# Patient Record
Sex: Male | Born: 1955 | Race: Black or African American | Hispanic: No | Marital: Married | Smoking: Never smoker
Health system: Southern US, Community
[De-identification: ages and names within clinical notes are randomized; demographics above are authoritative.]

## PROBLEM LIST (undated history)

## (undated) DIAGNOSIS — N189 Chronic kidney disease, unspecified: Secondary | ICD-10-CM

## (undated) DIAGNOSIS — E785 Hyperlipidemia, unspecified: Secondary | ICD-10-CM

## (undated) DIAGNOSIS — Z9289 Personal history of other medical treatment: Secondary | ICD-10-CM

## (undated) DIAGNOSIS — I119 Hypertensive heart disease without heart failure: Secondary | ICD-10-CM

## (undated) DIAGNOSIS — I10 Essential (primary) hypertension: Secondary | ICD-10-CM

## (undated) DIAGNOSIS — G4733 Obstructive sleep apnea (adult) (pediatric): Secondary | ICD-10-CM

## (undated) HISTORY — DX: Personal history of other medical treatment: Z92.89

## (undated) HISTORY — DX: Hypertensive heart disease without heart failure: I11.9

## (undated) HISTORY — DX: Hyperlipidemia, unspecified: E78.5

## (undated) HISTORY — DX: Chronic kidney disease, unspecified: N18.9

## (undated) HISTORY — DX: Obstructive sleep apnea (adult) (pediatric): G47.33

---

## 2009-03-22 HISTORY — PX: US ECHOCARDIOGRAPHY: HXRAD669

## 2010-01-01 ENCOUNTER — Emergency Department (HOSPITAL_COMMUNITY)
Admission: EM | Admit: 2010-01-01 | Discharge: 2010-01-01 | Payer: Self-pay | Source: Home / Self Care | Admitting: Emergency Medicine

## 2010-01-01 IMAGING — CR DG CHEST 2V
2 series · 2 of 2 positions shown · non-contrast
Comparison: None

CLINICAL DATA: Chest pain.

CHEST - 2 VIEW

[w chest pa]
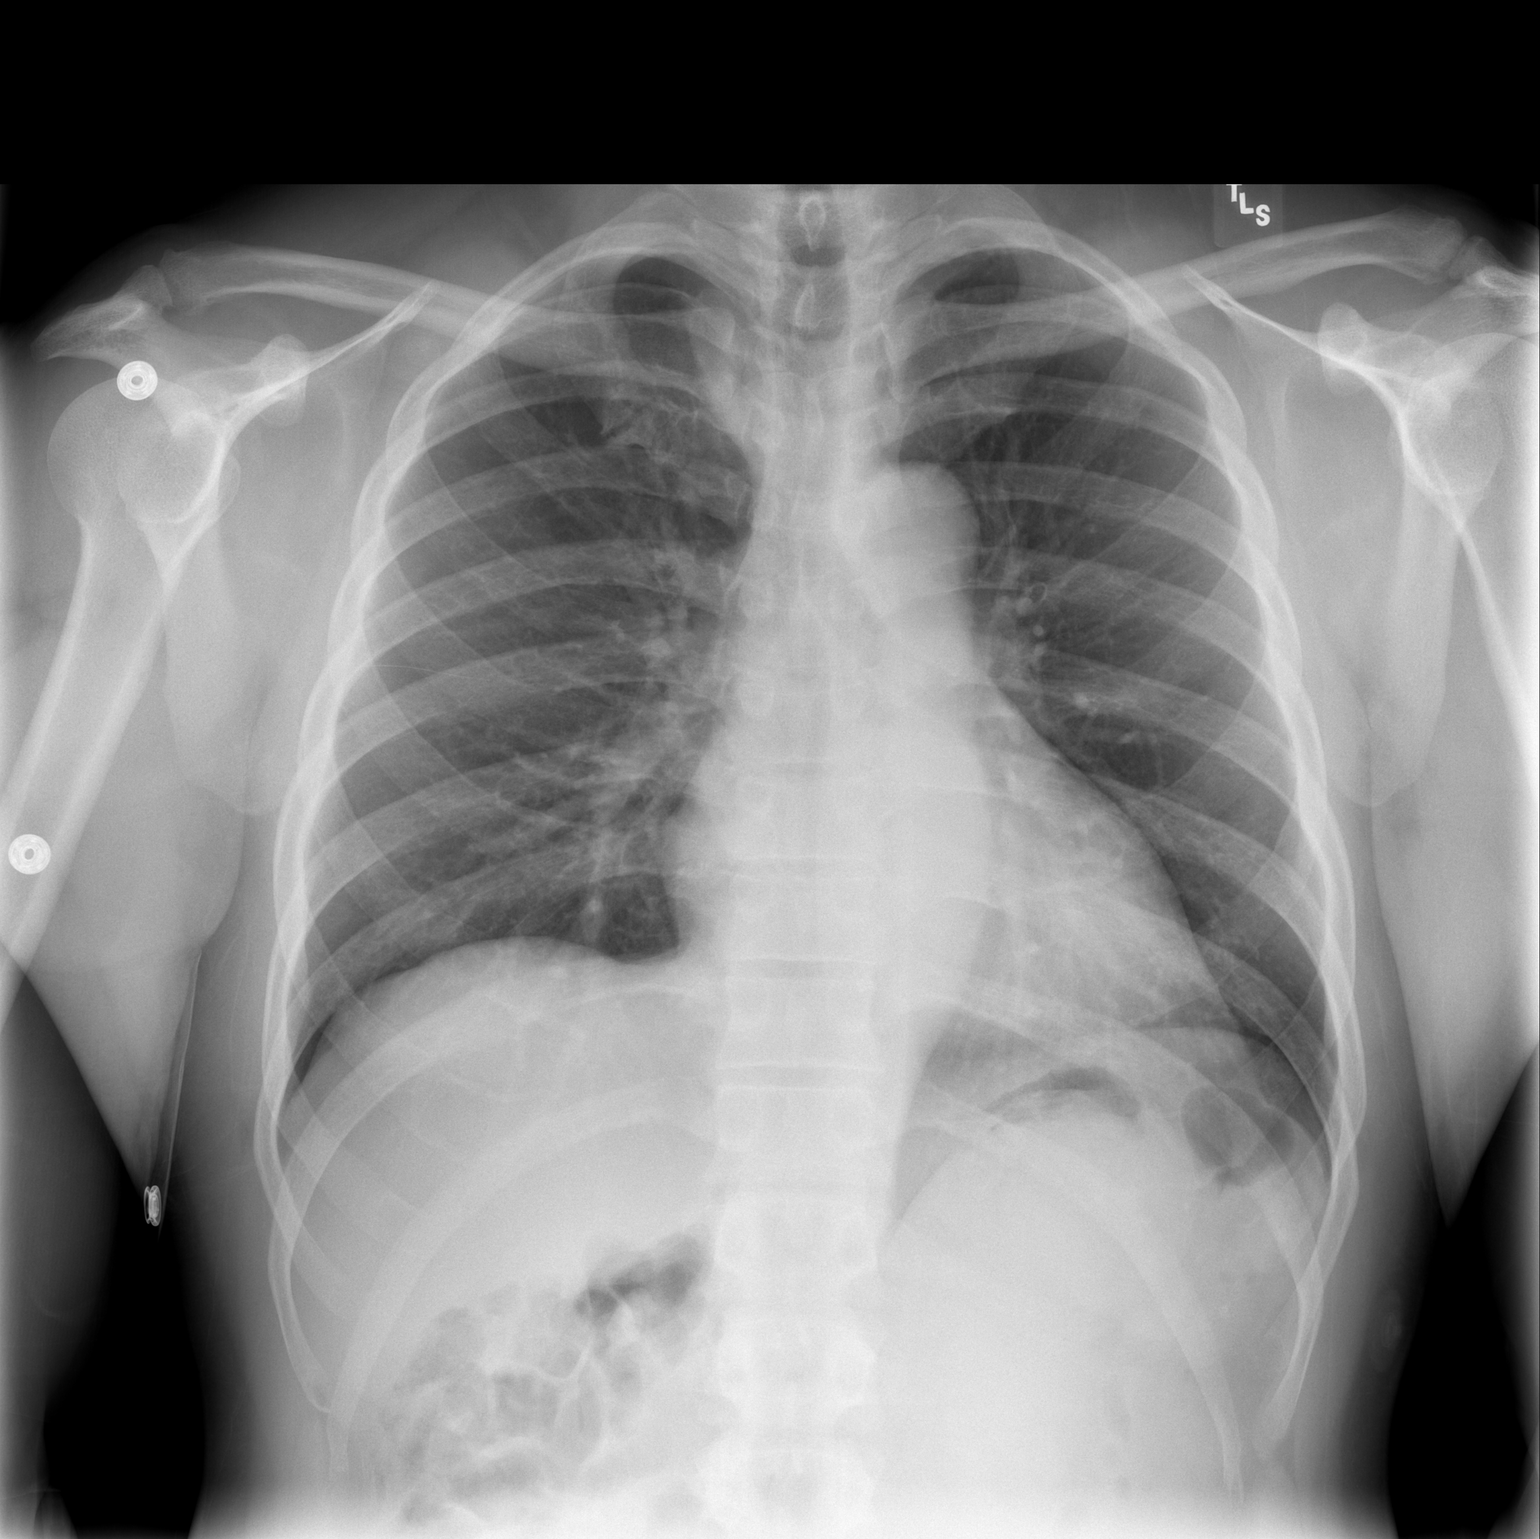

[w chest lat *]
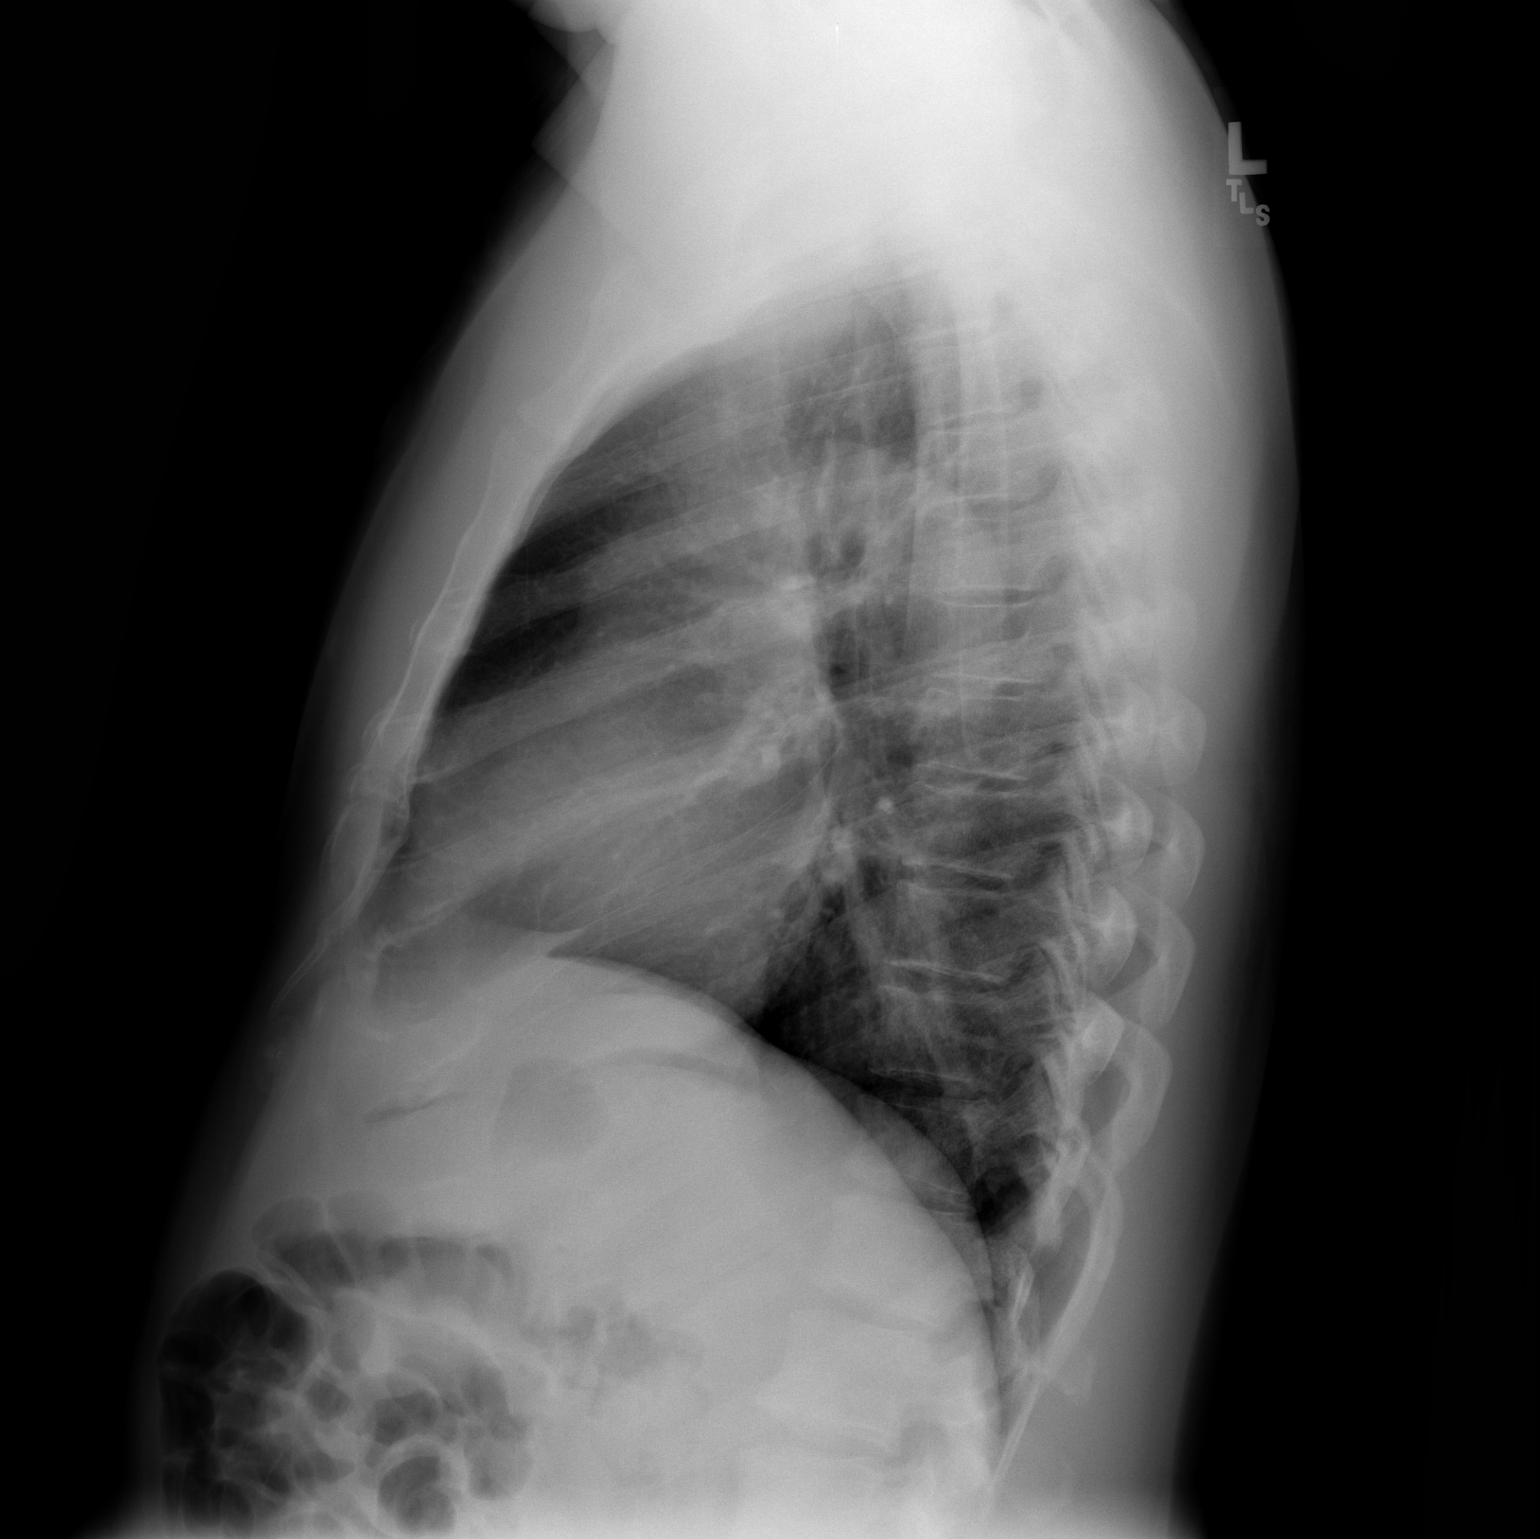

[2 of 2 positions shown; findings below may reference images not displayed]

FINDINGS: Heart and mediastinal contours are within normal limits.
No focal opacities or effusions.  No acute bony abnormality.
IMPRESSION: No active disease.

## 2010-01-11 HISTORY — PX: NM MYOCAR PERF WALL MOTION: HXRAD629

## 2010-03-28 LAB — POCT CARDIAC MARKERS: Myoglobin, poc: 222 ng/mL (ref 12–200)

## 2012-08-04 ENCOUNTER — Encounter: Payer: Self-pay | Admitting: *Deleted

## 2012-08-06 ENCOUNTER — Encounter: Payer: Self-pay | Admitting: Cardiovascular Disease

## 2012-08-07 ENCOUNTER — Ambulatory Visit (INDEPENDENT_AMBULATORY_CARE_PROVIDER_SITE_OTHER): Payer: Self-pay | Admitting: Cardiovascular Disease

## 2012-08-07 ENCOUNTER — Encounter: Payer: Self-pay | Admitting: Cardiovascular Disease

## 2012-08-07 VITALS — BP 122/82 | HR 64 | Resp 12 | Ht 68.0 in | Wt 178.9 lb

## 2012-08-07 DIAGNOSIS — N529 Male erectile dysfunction, unspecified: Secondary | ICD-10-CM

## 2012-08-07 DIAGNOSIS — E785 Hyperlipidemia, unspecified: Secondary | ICD-10-CM

## 2012-08-07 DIAGNOSIS — I10 Essential (primary) hypertension: Secondary | ICD-10-CM

## 2012-08-07 DIAGNOSIS — N508 Other specified disorders of male genital organs: Secondary | ICD-10-CM

## 2012-08-07 DIAGNOSIS — I119 Hypertensive heart disease without heart failure: Secondary | ICD-10-CM

## 2012-08-07 DIAGNOSIS — N5319 Other ejaculatory dysfunction: Secondary | ICD-10-CM

## 2012-08-07 NOTE — Patient Instructions (Addendum)
Your physician recommends that you schedule a follow-up appointment in: One year.  

## 2012-08-11 ENCOUNTER — Encounter: Payer: Self-pay | Admitting: Cardiovascular Disease

## 2012-08-11 DIAGNOSIS — N5319 Other ejaculatory dysfunction: Secondary | ICD-10-CM | POA: Insufficient documentation

## 2012-08-11 DIAGNOSIS — I10 Essential (primary) hypertension: Secondary | ICD-10-CM | POA: Insufficient documentation

## 2012-08-11 DIAGNOSIS — I119 Hypertensive heart disease without heart failure: Secondary | ICD-10-CM | POA: Insufficient documentation

## 2012-08-11 DIAGNOSIS — E785 Hyperlipidemia, unspecified: Secondary | ICD-10-CM | POA: Insufficient documentation

## 2012-08-11 DIAGNOSIS — N529 Male erectile dysfunction, unspecified: Secondary | ICD-10-CM | POA: Insufficient documentation

## 2012-08-11 NOTE — Progress Notes (Signed)
Patient ID: Jeffery Strickland, male   DOB: 07/19/1955, 57 y.o.   MRN: [id number]      Reason for office visit Hypertensive heart disease  He has done quite well since his last appointment and has no new complaints today. He has good functional status and goes walking on a regular basis. Unfortunately has not made much progress with weight loss and remains moderately overweight. 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 Switching beta blockers did help to some degree.    No Known Allergies  Current Outpatient Prescriptions  Medication Sig Dispense Refill  . aspirin 81 MG tablet Take 81 mg by mouth daily.      . nebivolol (BYSTOLIC) 10 MG tablet Take 10 mg by mouth daily.      . Olmesartan-Amlodipine-HCTZ (TRIBENZOR) 40-10-25 MG TABS Take 1 tablet by mouth daily.      . potassium chloride SA (K-DUR,KLOR-CON) 20 MEQ tablet Take 20 mEq by mouth 2 (two) times daily.      . rosuvastatin (CRESTOR) 10 MG tablet Take 10 mg by mouth daily.      Marland Kitchen spironolactone (ALDACTONE) 25 MG tablet daily.       No current facility-administered medications for this visit.    Past Medical History  Diagnosis Date  . Systemic hypertension   . Hyperlipemia     Past Surgical History  Procedure Laterality Date  . 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 Nm myocar perf wall motion  01/11/2010    normal    Family History  Problem Relation Age of Onset  . Heart attack Father   . Cancer Sister   . Diabetes Sister     History   Social History  . Marital Status: Single    Spouse Name: N/A    Number of Children: N/A  . Years of Education: N/A   Occupational History  . Not on file.   Social History Main Topics  . Smoking status: Never Smoker   . Smokeless tobacco: Not on file  . Alcohol Use: No  . Drug Use: No  . Sexually Active: Not on file   Other  Topics Concern  . Not on file   Social History Narrative  . No narrative on file    Review of systems: The patient specifically denies any chest pain at rest or with exertion, dyspnea at rest or with exertion, orthopnea, paroxysmal nocturnal dyspnea, syncope, palpitations, focal neurological deficits, intermittent claudication, lower extremity edema, unexplained weight gain, cough, hemoptysis or wheezing.  The patient also denies abdominal pain, nausea, vomiting, dysphagia, diarrhea, constipation, polyuria, polydipsia, dysuria, hematuria, frequency, urgency, abnormal bleeding or bruising, fever, chills, unexpected weight changes, mood swings, change in skin or hair texture, change in voice quality, auditory or visual problems, allergic reactions or rashes, new musculoskeletal complaints other than usual "aches and pains".   PHYSICAL EXAM BP 122/82  Pulse 64  Resp 12  Ht 5\' 8"  (1.727 m)  Wt 178 lb 14.4 oz (81.149 kg)  BMI 27.21 kg/m2  General: Alert, oriented x3, no distress Head: no evidence of trauma, PERRL, EOMI, no exophtalmos or lid lag, no myxedema, no xanthelasma; normal ears, nose and oropharynx Neck: normal jugular venous pulsations and no hepatojugular reflux; brisk carotid pulses without delay and no carotid bruits Chest: clear to auscultation, no signs of consolidation by percussion or palpation, normal fremitus, symmetrical and full respiratory excursions  Cardiovascular: normal position and quality of the apical impulse, regular rhythm, normal first and second heart sounds, no murmurs, rubs or gallops Abdomen: no tenderness or distention, no masses by palpation, no abnormal pulsatility or arterial bruits, normal bowel sounds, no hepatosplenomegaly Extremities: no clubbing, cyanosis or edema; 2+ radial, ulnar and brachial pulses bilaterally; 2+ right femoral, posterior tibial and dorsalis pedis pulses; 2+ left femoral, posterior tibial and dorsalis pedis pulses; no subclavian or  femoral bruits Neurological: grossly nonfocal   EKG: Normal sinus rhythm, LVH with secondary changes  Most recent labs from 2013 Lipid Panel  Cholesterol 141, triglycerides 96, HDL 41, LDL 81  BMET Creatinine 1.29 , K 3.3, normal liver function tests   ASSESSMENT AND PLAN Hypertensive heart disease without CHF He is evidence of left ventricular hypertrophy by ecg, less prominently so on echo. He had a normal nuclear stress test in 2011. Most recent creatinine is within normal limits at 1.29. Blood pressure control is good.  Other ejaculatory dysfunction Switching to bystolic instead of metoprolol seems to have helped partially.  Hyperlipidemia Satisfactory lipid profile on current therapy.  HTN (hypertension) No evidence of renal artery stenosis by duplex ultrasound in 2011  Orders Placed This Encounter  Procedures  . EKG 12-Lead    Jeffery Strickland  Jeffery Fair, MD, [facility] [phone] office [phone] pager

## 2012-08-11 NOTE — Assessment & Plan Note (Signed)
He is evidence of left ventricular hypertrophy by ecg, less prominently so on echo. He had a normal nuclear stress test in 2011. Most recent creatinine is within normal limits at 1.29. Blood pressure control is good.

## 2012-08-11 NOTE — Assessment & Plan Note (Signed)
Switching to bystolic instead of metoprolol seems to have helped partially.

## 2012-08-11 NOTE — Assessment & Plan Note (Signed)
No evidence of renal artery stenosis by duplex ultrasound in 2011

## 2012-08-11 NOTE — Assessment & Plan Note (Signed)
Satisfactory lipid profile on current therapy.

## 2012-08-13 ENCOUNTER — Other Ambulatory Visit: Payer: Self-pay | Admitting: Cardiovascular Disease

## 2012-08-13 NOTE — Telephone Encounter (Signed)
Rx was sent to pharmacy electronically. 

## 2012-12-01 ENCOUNTER — Ambulatory Visit (INDEPENDENT_AMBULATORY_CARE_PROVIDER_SITE_OTHER): Payer: BC Managed Care – PPO | Admitting: Emergency Medicine

## 2012-12-01 VITALS — BP 104/70 | HR 79 | Temp 98.7°F | Resp 16 | Ht 67.8 in | Wt 180.6 lb

## 2012-12-01 DIAGNOSIS — S61209A Unspecified open wound of unspecified finger without damage to nail, initial encounter: Secondary | ICD-10-CM

## 2012-12-01 DIAGNOSIS — Z23 Encounter for immunization: Secondary | ICD-10-CM

## 2012-12-01 DIAGNOSIS — M79609 Pain in unspecified limb: Secondary | ICD-10-CM

## 2012-12-01 NOTE — Progress Notes (Signed)
VCO. Metacarpal block with 2% lidocaine plain.  Betadine prep.  Small <0.5 cm incision made distal to puncture wound. Splinter removed easily.  Wound explored revealing no other wood pieces or FB.  Repaired with #1 SI suture using 5-0 ethilon.  Cleaned and band-aid applied. Patient tolerated well. Return in 7 days for suture removal.

## 2012-12-01 NOTE — Progress Notes (Signed)
[facility] [address], [place] [address] [phone]  Date:  12/01/2012   Name:  Jeffery Strickland   DOB:  06/27/1955   MRN:  [id number]  PCP:  Geraldo Pitter, MD    Chief Complaint: F.B. finger   History of Present Illness:  Garik Diamant is a 57 y.o. very pleasant male patient who presents with the following:  Working outside and impaled his finger (right middle) with a splinter.  Has removed a portion of the splinter in the dorsum of his finger but has pain with flexion of the finger.  Not current on TD.  Denies other complaint or health concern today.   Patient Active Problem List   Diagnosis Date Noted  . HTN (hypertension) 08/11/2012  . Hyperlipidemia 08/11/2012  . Hypertensive heart disease without CHF 08/11/2012  . Other ejaculatory dysfunction 08/11/2012    Past Medical History  Diagnosis Date  . Systemic hypertension   . Hyperlipemia     Past Surgical History  Procedure Laterality Date  . 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 Nm myocar perf wall motion  01/11/2010    normal    History  Substance Use Topics  . Smoking status: Never Smoker   . Smokeless tobacco: Not on file  . Alcohol Use: No    Family History  Problem Relation Age of Onset  . Heart attack Father   . Cancer Sister   . Diabetes Sister     No Known Allergies  Medication list has been reviewed and updated.  Current Outpatient Prescriptions on File Prior to Visit  Medication Sig Dispense Refill  . aspirin 81 MG tablet Take 81 mg by mouth daily.      . nebivolol (BYSTOLIC) 10 MG tablet Take 10 mg by mouth daily.      . potassium chloride SA (K-DUR,KLOR-CON) 20 MEQ tablet Take 20 mEq by mouth 2 (two) times daily.      . rosuvastatin (CRESTOR) 10 MG tablet Take 10 mg by mouth daily.      Marya Landry 40-10-25 MG TABS TAKE 1 TABLET BY MOUTH DAILY.  30 tablet  7    No current facility-administered medications on file prior to visit.    Review of Systems:  As per HPI, otherwise negative.    Physical Examination: Filed Vitals:   12/01/12 1537  BP: 104/70  Pulse: 79  Temp: 98.7 F (37.1 C)  Resp: 16   Filed Vitals:   12/01/12 1537  Height: 5' 7.8" (1.722 m)  Weight: 180 lb 9.6 oz (81.92 kg)   Body mass index is 27.63 kg/(m^2). Ideal Body Weight: Weight in (lb) to have BMI = 25: 163.1   GEN: WDWN, NAD, Non-toxic, Alert & Oriented x 3 HEENT: Atraumatic, Normocephalic.  Ears and Nose: No external deformity. EXTR: No clubbing/cyanosis/edema NEURO: Normal gait.  PSYCH: Normally interactive. Conversant. Not depressed or anxious appearing.  Calm demeanor.  Right hand puncture wound on middle phalange dorsal third finger.  No visible FB  Assessment and Plan: Foreign body right fourth finger TDAP  Signed,  Phillips Odor, MD

## 2012-12-22 ENCOUNTER — Ambulatory Visit (INDEPENDENT_AMBULATORY_CARE_PROVIDER_SITE_OTHER): Payer: BC Managed Care – PPO | Admitting: Physician Assistant

## 2012-12-22 VITALS — Temp 98.1°F

## 2012-12-22 DIAGNOSIS — S61209A Unspecified open wound of unspecified finger without damage to nail, initial encounter: Secondary | ICD-10-CM

## 2012-12-22 NOTE — Progress Notes (Signed)
Removed one suture from right middle finger after evaluation by Porfirio Oar, PA-C

## 2013-01-13 ENCOUNTER — Other Ambulatory Visit: Payer: Self-pay | Admitting: *Deleted

## 2013-01-13 MED ORDER — NEBIVOLOL HCL 10 MG PO TABS: 10.0000 mg | ORAL_TABLET | Freq: Every day | ORAL | Status: DC

## 2013-01-13 NOTE — Telephone Encounter (Signed)
Rx was sent to pharmacy electronically. 

## 2013-11-22 ENCOUNTER — Ambulatory Visit (INDEPENDENT_AMBULATORY_CARE_PROVIDER_SITE_OTHER): Payer: BC Managed Care – PPO | Admitting: Family Medicine

## 2013-11-22 VITALS — BP 112/64 | HR 59 | Temp 98.1°F | Resp 16 | Ht 67.0 in | Wt 173.2 lb

## 2013-11-22 DIAGNOSIS — R3129 Other microscopic hematuria: Secondary | ICD-10-CM

## 2013-11-22 DIAGNOSIS — R55 Syncope and collapse: Secondary | ICD-10-CM

## 2013-11-22 DIAGNOSIS — R312 Other microscopic hematuria: Secondary | ICD-10-CM

## 2013-11-22 LAB — POCT UA - MICROSCOPIC ONLY
Bacteria, U Microscopic: NEGATIVE
CASTS, UR, LPF, POC: NEGATIVE
CRYSTALS, UR, HPF, POC: NEGATIVE
Epithelial cells, urine per micros: NEGATIVE
Mucus, UA: NEGATIVE
WBC, UR, HPF, POC: NEGATIVE
Yeast, UA: NEGATIVE

## 2013-11-22 LAB — POCT CBC
GRANULOCYTE PERCENT: 73.7 % (ref 37–80)
HCT, POC: 48.6 % (ref 43.5–53.7)
HEMOGLOBIN: 15.9 g/dL (ref 14.1–18.1)
LYMPH, POC: 2.1 (ref 0.6–3.4)
MCH, POC: 29 pg (ref 27–31.2)
MCHC: 32.8 g/dL (ref 31.8–35.4)
MCV: 88.5 fL (ref 80–97)
MID (CBC): 0.3 (ref 0–0.9)
MPV: 7.7 fL (ref 0–99.8)
PLATELET COUNT, POC: 230 10*3/uL (ref 142–424)
POC Granulocyte: 6.6 (ref 2–6.9)
POC LYMPH %: 22.8 % (ref 10–50)
POC MID %: 3.5 % (ref 0–12)
RBC: 5.49 M/uL (ref 4.69–6.13)
RDW, POC: 13.8 %
WBC: 9 10*3/uL (ref 4.6–10.2)

## 2013-11-22 LAB — POCT GLYCOSYLATED HEMOGLOBIN (HGB A1C): HEMOGLOBIN A1C: 5.7

## 2013-11-22 LAB — POCT URINALYSIS DIPSTICK
BILIRUBIN UA: NEGATIVE
Glucose, UA: NEGATIVE
KETONES UA: NEGATIVE
LEUKOCYTES UA: NEGATIVE
Nitrite, UA: NEGATIVE
PH UA: 5.5
Protein, UA: NEGATIVE
Spec Grav, UA: 1.025
UROBILINOGEN UA: 0.2

## 2013-11-22 LAB — GLUCOSE, POCT (MANUAL RESULT ENTRY): POC Glucose: 92 mg/dl (ref 70–99)

## 2013-11-22 NOTE — Progress Notes (Addendum)
Subjective:  This chart was scribed for Norberto SorensonEva Rani Idler, MD by Nishan PaoNadim Abu Hashem, ED Scribe at [facility].The patient was seen in exam room 14 and the patient's care was started at 4:00 PM.   Patient ID: Jeffery Strickland, male    DOB: 1955/12/25, 58 y.o.   MRN: [id number] Chief Complaint  Patient presents with  . Weakness    Had an episode this afternoon with diaphoresis, dizziness, and all over weakness. Lasted about 15 mins.- feels fine now   HPI Comments: Jeffery CapHaywood Cihlar is a 58 y.o. male [facility] that occurred this afternoon. Pt notes he felt light headed so he sat down. He suddenly broke out in a sweat, he says it felt like he was working out in the sun. He says when he was standing up he did feel light headed but when he sat down he was fine. Pt has nausea, dizziness and weakness as associated symptoms.  Pt says after 15 min his episode gradually subsided. He notes a similar complaint when he was out in the yard working about 6-9 months ago. He denies CP, abdominal pain, melena, abnormal urine, vision changes, and edema in his lower extremities.  He has been drinking plenty of water and did not over exert himself yesterday.  He says his son has been sick and showing similar symptoms.  Doctor check up in October and everything was fine  No changes in his medication, he has not ran out of his medication. Pt notes he has not taken his HTN medication this morning. He usually takes them at 8:00 AM and he states it is not uncommon for him to miss a dose in the weekend.   Pt note DM runs in his family and his wife believes his blood sugar may have fallen.  His last visit with Dr. Amanda Cockayneroitou his cardiologist was last year and [facility]   Past Medical History  Diagnosis Date  . Systemic hypertension   . Hyperlipemia    Current Outpatient Prescriptions on File Prior to Visit  Medication Sig Dispense Refill  .  aspirin 81 MG tablet Take 81 mg by mouth daily.    . nebivolol (BYSTOLIC) 10 MG tablet Take 1 tablet (10 mg total) by mouth daily. 30 tablet 7  . potassium chloride SA (K-DUR,KLOR-CON) 20 MEQ tablet Take 20 mEq by mouth 2 (two) times daily.    . rosuvastatin (CRESTOR) 10 MG tablet Take 10 mg by mouth daily.    Marya Landry. TRIBENZOR 40-10-25 MG TABS TAKE 1 TABLET BY MOUTH DAILY. 30 tablet 7   No current facility-administered medications on file prior to visit.   No Known Allergies  Weakness Associated symptoms include diaphoresis, nausea and weakness. Pertinent negatives include no abdominal pain or chest pain.   Review of Systems  Constitutional: Positive for diaphoresis.  Eyes: Negative for visual disturbance.  Cardiovascular: Negative for chest pain and leg swelling.  Gastrointestinal: Positive for nausea. Negative for abdominal pain.  Neurological: Positive for dizziness, weakness and light-headedness.      Objective:  BP 112/64 mmHg  Pulse 59  Temp(Src) 98.1 F (36.7 C) (Oral)  Resp 16  Ht 5\' 7"  (1.702 m)  Wt 173 lb 3.2 oz (78.563 kg)  BMI 27.12 kg/m2  SpO2 99%  Physical Exam  Constitutional: He is oriented to person, place, and time. He appears well-developed and well-nourished. No distress.  HENT:  Head: Normocephalic and  atraumatic.  Right Ear: Hearing, tympanic membrane, external ear and ear canal normal.  Left Ear: Hearing, tympanic membrane, external ear and ear canal normal.  Nose: Mucosal edema present. No rhinorrhea.  Mouth/Throat: Uvula is midline, oropharynx is clear and moist and mucous membranes are normal. No oropharyngeal exudate.  Mid ear effusion on the right.  Eyes: Conjunctivae and EOM are normal. Pupils are equal, round, and reactive to light. No scleral icterus.  Neck: Normal range of motion. Neck supple. No thyromegaly present.  Cardiovascular: Normal rate, regular rhythm, S1 normal, S2 normal and normal heart sounds.   Pulmonary/Chest: Effort normal and  breath sounds normal.  Lung are clear.  Abdominal: Soft. He exhibits no distension. There is no splenomegaly or hepatomegaly. There is no tenderness.  Lymphadenopathy:    He has no cervical adenopathy.  Increased right lobe of thyroid.  Neurological: He is alert and oriented to person, place, and time. He displays normal reflexes. No cranial nerve deficit or sensory deficit. He exhibits normal muscle tone. Coordination and gait normal.  Skin: Skin is warm and dry. He is not diaphoretic.  Psychiatric: He has a normal mood and affect. His behavior is normal.     negative orthostatics. Peak flow normal at 510 EKG: NSR, flipped T-waves in leads I, aVL, v6 and borderline LVH - no significant change from prior EKG in 07/2012.  Assessment & Plan:   Pre-syncope - Plan: POCT CBC, POCT glucose (manual entry), POCT glycosylated hemoglobin (Hb A1C), POCT UA - Microscopic Only, POCT urinalysis dipstick, Comprehensive metabolic panel, TSH, EKG 12-Lead, Sedimentation rate, CANCELED: POCT SEDIMENTATION RATE  Hematuria, microscopic   RTC immed if sxs worsen at all. Rec neurology evaluation for recurrent episodes of syncope which pt will plan to do in future.   Recheck thyroid at f/u - send for [place] if any abnormality at that time.  I personally performed the services described in this documentation, which was scribed in my presence. The recorded information has been reviewed and considered, and addended by me as needed.  Norberto Sorenson, MD MPH   Results for orders placed or performed in visit on 11/22/13  Comprehensive metabolic panel  Result Value Ref Range   Sodium 139 135 - 145 mEq/L   Potassium 4.3 3.5 - 5.3 mEq/L   Chloride 102 96 - 112 mEq/L   CO2 25 19 - 32 mEq/L   Glucose, Bld 99 70 - 99 mg/dL   BUN 21 6 - 23 mg/dL   Creat 1.61 (H) 0.96 - 1.35 mg/dL   Total Bilirubin 0.6 0.2 - 1.2 mg/dL   Alkaline Phosphatase 54 39 - 117 U/L   AST 17 0 - 37 U/L   ALT 23 0 - 53 U/L   Total Protein 8.2 6.0 - 8.3  g/dL   Albumin 5.1 3.5 - 5.2 g/dL   Calcium 04.5 8.4 - 40.9 mg/dL  TSH  Result Value Ref Range   TSH 1.623 0.350 - 4.500 uIU/mL  Sedimentation rate  Result Value Ref Range   Sed Rate 1 0 - 16 mm/hr  POCT CBC  Result Value Ref Range   WBC 9.0 4.6 - 10.2 K/uL   Lymph, poc 2.1 0.6 - 3.4   POC LYMPH PERCENT 22.8 10 - 50 %L   MID (cbc) 0.3 0 - 0.9   POC MID % 3.5 0 - 12 %M   POC Granulocyte 6.6 2 - 6.9   Granulocyte percent 73.7 37 - 80 %G   RBC 5.49 4.69 -  6.13 M/uL   Hemoglobin 15.9 14.1 - 18.1 g/dL   HCT, POC 11.948.6 14.743.5 - 53.7 %   MCV 88.5 80 - 97 fL   MCH, POC 29.0 27 - 31.2 pg   MCHC 32.8 31.8 - 35.4 g/dL   RDW, POC 82.913.8 %   Platelet Count, POC 230 142 - 424 K/uL   MPV 7.7 0 - 99.8 fL  POCT glucose (manual entry)  Result Value Ref Range   POC Glucose 92 70 - 99 mg/dl  POCT glycosylated hemoglobin (Hb A1C)  Result Value Ref Range   Hemoglobin A1C 5.7   POCT UA - Microscopic Only  Result Value Ref Range   WBC, Ur, HPF, POC neg    RBC, urine, microscopic 1-3    Bacteria, U Microscopic neg    Mucus, UA neg    Epithelial cells, urine per micros neg    Crystals, Ur, HPF, POC neg    Casts, Ur, LPF, POC neg    Yeast, UA neg   POCT urinalysis dipstick  Result Value Ref Range   Color, UA yellow    Clarity, UA clear    Glucose, UA neg    Bilirubin, UA neg    Ketones, UA neg    Spec Grav, UA 1.025    Blood, UA moderate    pH, UA 5.5    Protein, UA neg    Urobilinogen, UA 0.2    Nitrite, UA neg    Leukocytes, UA Negative

## 2013-11-22 NOTE — Patient Instructions (Signed)
Near-Syncope Near-syncope (commonly known as near fainting) is sudden weakness, dizziness, or feeling like you might pass out. During an episode of near-syncope, you may also develop pale skin, have tunnel vision, or feel sick to your stomach (nauseous). Near-syncope may occur when getting up after sitting or while standing for a long time. It is caused by a sudden decrease in blood flow to the brain. This decrease can result from various causes or triggers, most of which are not serious. However, because near-syncope can sometimes be a sign of something serious, a medical evaluation is required. The specific cause is often not determined. HOME CARE INSTRUCTIONS  Monitor your condition for any changes. The following actions may help to alleviate any discomfort you are experiencing:  Have someone stay with you until you feel stable.  Lie down right away and prop your feet up if you start feeling like you might faint. Breathe deeply and steadily. Wait until all the symptoms have passed. Most of these episodes last only a few minutes. You may feel tired for several hours.   Drink enough fluids to keep your urine clear or pale yellow.   If you are taking blood pressure or heart medicine, get up slowly when seated or lying down. Take several minutes to sit and then stand. This can reduce dizziness.  Follow up with your health care provider as directed. SEEK IMMEDIATE MEDICAL CARE IF:   You have a severe headache.   You have unusual pain in the chest, abdomen, or back.   You are bleeding from the mouth or rectum, or you have black or tarry stool.   You have an irregular or very fast heartbeat.   You have repeated fainting or have seizure-like jerking during an episode.   You faint when sitting or lying down.   You have confusion.   You have difficulty walking.   You have severe weakness.   You have vision problems.  MAKE SURE YOU:   Understand these instructions.  Will  watch your condition.  Will get help right away if you are not doing well or get worse. Document Released: 01/02/2005 Document Revised: 01/07/2013 Document Reviewed: 06/07/2012 ExitCare Patient Information 2015 [employer]. This information is not intended to replace advice given to you by your health care provider. Make sure you discuss any questions you have with your health care provider.  

## 2013-11-23 LAB — COMPREHENSIVE METABOLIC PANEL
ALBUMIN: 5.1 g/dL (ref 3.5–5.2)
ALK PHOS: 54 U/L (ref 39–117)
ALT: 23 U/L (ref 0–53)
AST: 17 U/L (ref 0–37)
BUN: 21 mg/dL (ref 6–23)
CALCIUM: 10.2 mg/dL (ref 8.4–10.5)
CO2: 25 mEq/L (ref 19–32)
CREATININE: 1.55 mg/dL — AB (ref 0.50–1.35)
Chloride: 102 mEq/L (ref 96–112)
Glucose, Bld: 99 mg/dL (ref 70–99)
Potassium: 4.3 mEq/L (ref 3.5–5.3)
SODIUM: 139 meq/L (ref 135–145)
Total Bilirubin: 0.6 mg/dL (ref 0.2–1.2)
Total Protein: 8.2 g/dL (ref 6.0–8.3)

## 2013-11-23 LAB — TSH: TSH: 1.623 u[IU]/mL (ref 0.350–4.500)

## 2013-11-23 LAB — SEDIMENTATION RATE: Sed Rate: 1 mm/hr (ref 0–16)

## 2014-03-06 ENCOUNTER — Encounter: Payer: Self-pay | Admitting: Physician Assistant

## 2014-03-06 ENCOUNTER — Ambulatory Visit (INDEPENDENT_AMBULATORY_CARE_PROVIDER_SITE_OTHER): Payer: BLUE CROSS/BLUE SHIELD | Admitting: Physician Assistant

## 2014-03-06 VITALS — BP 108/64

## 2014-03-06 DIAGNOSIS — I10 Essential (primary) hypertension: Secondary | ICD-10-CM

## 2014-03-06 DIAGNOSIS — E785 Hyperlipidemia, unspecified: Secondary | ICD-10-CM

## 2014-03-06 MED ORDER — OLMESARTAN-AMLODIPINE-HCTZ 40-10-25 MG PO TABS: ORAL_TABLET | ORAL | Status: DC

## 2014-03-06 NOTE — Assessment & Plan Note (Signed)
Blood pressures well-controlled usually runs on the lower side of normal. Patient is exercising regularly. We will decrease his Tribenzor to half and he will continue to monitor his blood pressure

## 2014-03-06 NOTE — Progress Notes (Signed)
Patient ID: Jeffery Strickland, male   DOB: 1955-06-18, 59 y.o.   MRN: [id number]    Date:  03/06/2014   ID:  Jeffery Strickland, DOB [birth year], MRN [id number]  PCP:  Geraldo Pitter, MD  Primary Cardiologist:  Croitoru  Chief Complaint  Patient presents with  . Follow-up    18 month visti.  About 6 months ago had an episode of diaphoresis and lighheadedness lasting about 5 mintues.  Did not feel like he was going to pass out. Evaluated at [facility].  Normal exam.  No complaints of chest pain.       History of Present Illness: Jeffery Strickland is a 59 y.o. male with a history of hypertension and hyperlipidemia.  He had normal stress test in 2011. Echocardiogram that same year showed normal ejection fraction with mild LVH.  He was last seen in the clinic in July 2014.  Patient presents for scheduled follow-up. About 6 months ago he had one episode of lightheadedness and diaphoresis which lasted about 5-10 minutes while he was standing in line at the store with his wife she was evaluated in urgent care and nothing specific was discovered. He's had no episodes since.  The patient currently denies nausea, vomiting, fever, chest pain, shortness of breath, orthopnea, dizziness, PND, cough, congestion, abdominal pain, hematochezia, melena, lower extremity edema, claudication.  Weight today was 176.6#  Wt Readings from Last 3 Encounters:  11/22/13 173 lb 3.2 oz (78.563 kg)  12/01/12 180 lb 9.6 oz (81.92 kg)  08/07/12 178 lb 14.4 oz (81.149 kg)     Past Medical History  Diagnosis Date  . Systemic hypertension   . Hyperlipemia     Current Outpatient Prescriptions  Medication Sig Dispense Refill  . aspirin 81 MG tablet Take 81 mg by mouth daily.    . nebivolol (BYSTOLIC) 10 MG tablet Take 1 tablet (10 mg total) by mouth daily. 30 tablet 7  . Olmesartan-Amlodipine-HCTZ (TRIBENZOR) 40-10-25 MG TABS Take 1/2 tablet daily. 30 tablet 7  . potassium chloride SA (K-DUR,KLOR-CON) 20 MEQ tablet Take 20 mEq by  mouth 2 (two) times daily.    . rosuvastatin (CRESTOR) 10 MG tablet Take 10 mg by mouth daily.     No current facility-administered medications for this visit.    Allergies:   No Known Allergies  Social History:  The patient  reports that he has never smoked. He does not have any smokeless tobacco history on file. He reports that he does not drink alcohol or use illicit drugs.   Family history:   Family History  Problem Relation Age of Onset  . Heart attack Father   . Cancer Sister   . Diabetes Sister     ROS:  Please see the history of present illness.  All other systems reviewed and negative.   PHYSICAL EXAM: VS:  BP 108/64 mmHg Well nourished, well developed, in no acute distress HEENT: Pupils are equal round react to light accommodation extraocular movements are intact.  Neck: no JVDNo cervical lymphadenopathy. Cardiac: Regular rate and rhythm without murmurs rubs or gallops. Lungs:  clear to auscultation bilaterally, no wheezing, rhonchi or rales Abd: soft, nontender, positive bowel sounds all quadrants, no hepatosplenomegaly Ext: no lower extremity edema.  2+ radial and dorsalis pedis pulses. Skin: warm and dry Neuro:  Grossly normal  EKG:  Normal sinus rhythm with inferior lateral T-wave inversions which are old  ASSESSMENT AND PLAN:  Problem List Items Addressed This Visit    Hyperlipidemia    Continue  statin      Relevant Medications   Olmesartan-Amlodipine-HCTZ (TRIBENZOR) 40-10-25 MG TABS   Other Relevant Orders   EKG 12-Lead   HTN (hypertension) - Primary    Blood pressures well-controlled usually runs on the lower side of normal. Patient is exercising regularly. We will decrease his Tribenzor to half and he will continue to monitor his blood pressure      Relevant Medications   Olmesartan-Amlodipine-HCTZ (TRIBENZOR) 40-10-25 MG TABS   Other Relevant Orders   EKG 12-Lead

## 2014-03-06 NOTE — Patient Instructions (Addendum)
DECREASE Tribenzor to 1/2 tablet a day.  Your physician recommends that you schedule a follow-up appointment in: 6 months with Dr. Royann Shiversroitoru.

## 2014-03-06 NOTE — Assessment & Plan Note (Signed)
Continue statin. 

## 2014-04-24 ENCOUNTER — Telehealth: Payer: Self-pay | Admitting: Cardiovascular Disease

## 2014-04-24 MED ORDER — NEBIVOLOL HCL 10 MG PO TABS: 10.0000 mg | ORAL_TABLET | Freq: Every day | ORAL | Status: DC

## 2014-04-24 NOTE — Telephone Encounter (Signed)
°  1. Which medications need to be refilled? Bystolic   2. Which pharmacy is medication to be sent to? [facility]  3. Do they need a 30 day or 90 day supply? 30  4. Would they like a call back once the medication has been sent to the pharmacy? yes

## 2014-04-24 NOTE — Telephone Encounter (Signed)
Rx(s) sent to pharmacy electronically. Unable to reach patient to notify Rx was sent in. Phone number provided was wrong

## 2014-05-04 ENCOUNTER — Ambulatory Visit (INDEPENDENT_AMBULATORY_CARE_PROVIDER_SITE_OTHER): Payer: BC Managed Care – PPO | Admitting: Family Medicine

## 2014-05-04 VITALS — BP 100/74 | HR 62 | Temp 98.2°F | Resp 12 | Ht 67.0 in | Wt 169.5 lb

## 2014-05-04 DIAGNOSIS — H65192 Other acute nonsuppurative otitis media, left ear: Secondary | ICD-10-CM

## 2014-05-04 DIAGNOSIS — R42 Dizziness and giddiness: Secondary | ICD-10-CM | POA: Diagnosis not present

## 2014-05-04 MED ORDER — AMOXICILLIN 875 MG PO TABS: 875.0000 mg | ORAL_TABLET | Freq: Two times a day (BID) | ORAL | Status: DC

## 2014-05-04 NOTE — Patient Instructions (Signed)

## 2014-05-04 NOTE — Progress Notes (Signed)
   Subjective:  This chart was scribed for Elvina SidleKurt Tazaria Dlugosz MD, by Veverly FellsHatice Demirci,scribe, at [facility].  This patient was seen in room 8 and the patient's care was started at 6:43 PM.    Patient ID: Jeffery Strickland, male    DOB: 1955-04-14, 59 y.o.   MRN: [id number]  Chief Complaint  Patient presents with  . Otalgia    Left  . Dizziness    HPI  HPI Comments: Jeffery Strickland is a 59 y.o. male who presents to [facility] for gradually worsening left ear pain along with intermittent diziness for a while now. Patient denies a fever, sore throat or congestion.  Patient takes medication for high blood pressure and does not have any allergies.   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  He has no other complaints today.    Review of Systems  Constitutional: Negative for fever and chills.  HENT: Positive for ear pain. Negative for congestion, hearing loss, mouth sores, nosebleeds, postnasal drip, rhinorrhea and sinus pressure.   Respiratory: Negative for cough and choking.   Cardiovascular: Negative for chest pain and leg swelling.  Neurological: Positive for dizziness. Negative for syncope, speech difficulty and weakness.       Objective:   Physical Exam BP 100/74 mmHg  Pulse 62  Temp(Src) 98.2 F (36.8 C) (Oral)  Resp 12  Ht 5\' 7"  (1.702 m)  Wt 169 lb 8 oz (76.885 kg)  BMI 26.54 kg/m2  SpO2 96%  His left ear drum is dull. Cranial nerve 3-12 is intact.  Rest of ENT exam is normal Skin shows no rash No periauricular swelling      Assessment & Plan:   This chart was scribed in my presence and reviewed by me personally.    ICD-9-CM ICD-10-CM   1. Acute nonsuppurative otitis media of left ear 381.00 H65.192 amoxicillin (AMOXIL) 875 MG tablet  2. Vertigo 780.4 R42      Signed, Elvina SidleKurt Evon Lopezperez, MD

## 2014-05-21 ENCOUNTER — Ambulatory Visit (INDEPENDENT_AMBULATORY_CARE_PROVIDER_SITE_OTHER): Payer: BC Managed Care – PPO | Admitting: Family Medicine

## 2014-05-21 ENCOUNTER — Ambulatory Visit (INDEPENDENT_AMBULATORY_CARE_PROVIDER_SITE_OTHER): Payer: BC Managed Care – PPO

## 2014-05-21 ENCOUNTER — Telehealth: Payer: Self-pay | Admitting: Cardiovascular Disease

## 2014-05-21 VITALS — BP 102/68 | HR 73 | Temp 98.1°F | Resp 20 | Wt 173.8 lb

## 2014-05-21 DIAGNOSIS — I10 Essential (primary) hypertension: Secondary | ICD-10-CM

## 2014-05-21 DIAGNOSIS — R079 Chest pain, unspecified: Secondary | ICD-10-CM

## 2014-05-21 DIAGNOSIS — R42 Dizziness and giddiness: Secondary | ICD-10-CM | POA: Diagnosis not present

## 2014-05-21 IMAGING — CR DG CHEST 2V
2 series · 2 of 2 positions shown · non-contrast
Comparison: None.

CLINICAL DATA: Chest pain.

EXAM:
CHEST  2 VIEW

[PA]
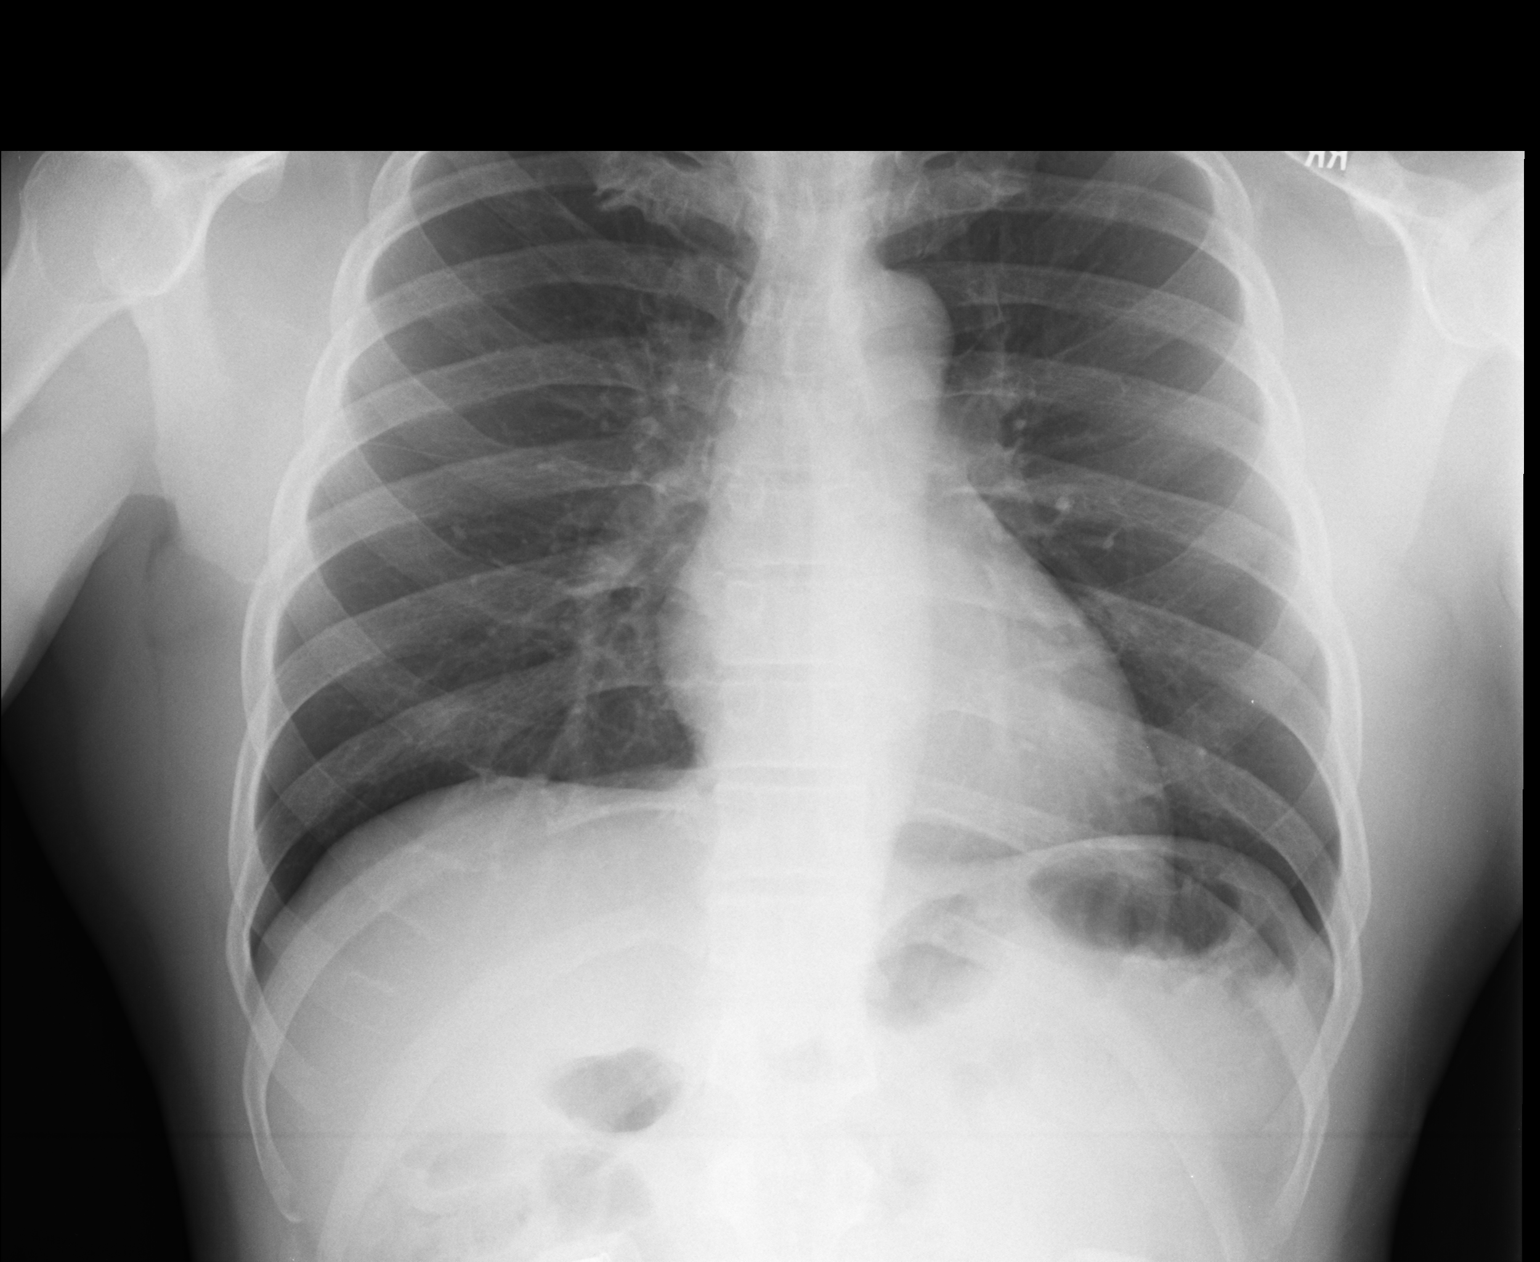

[lateral]
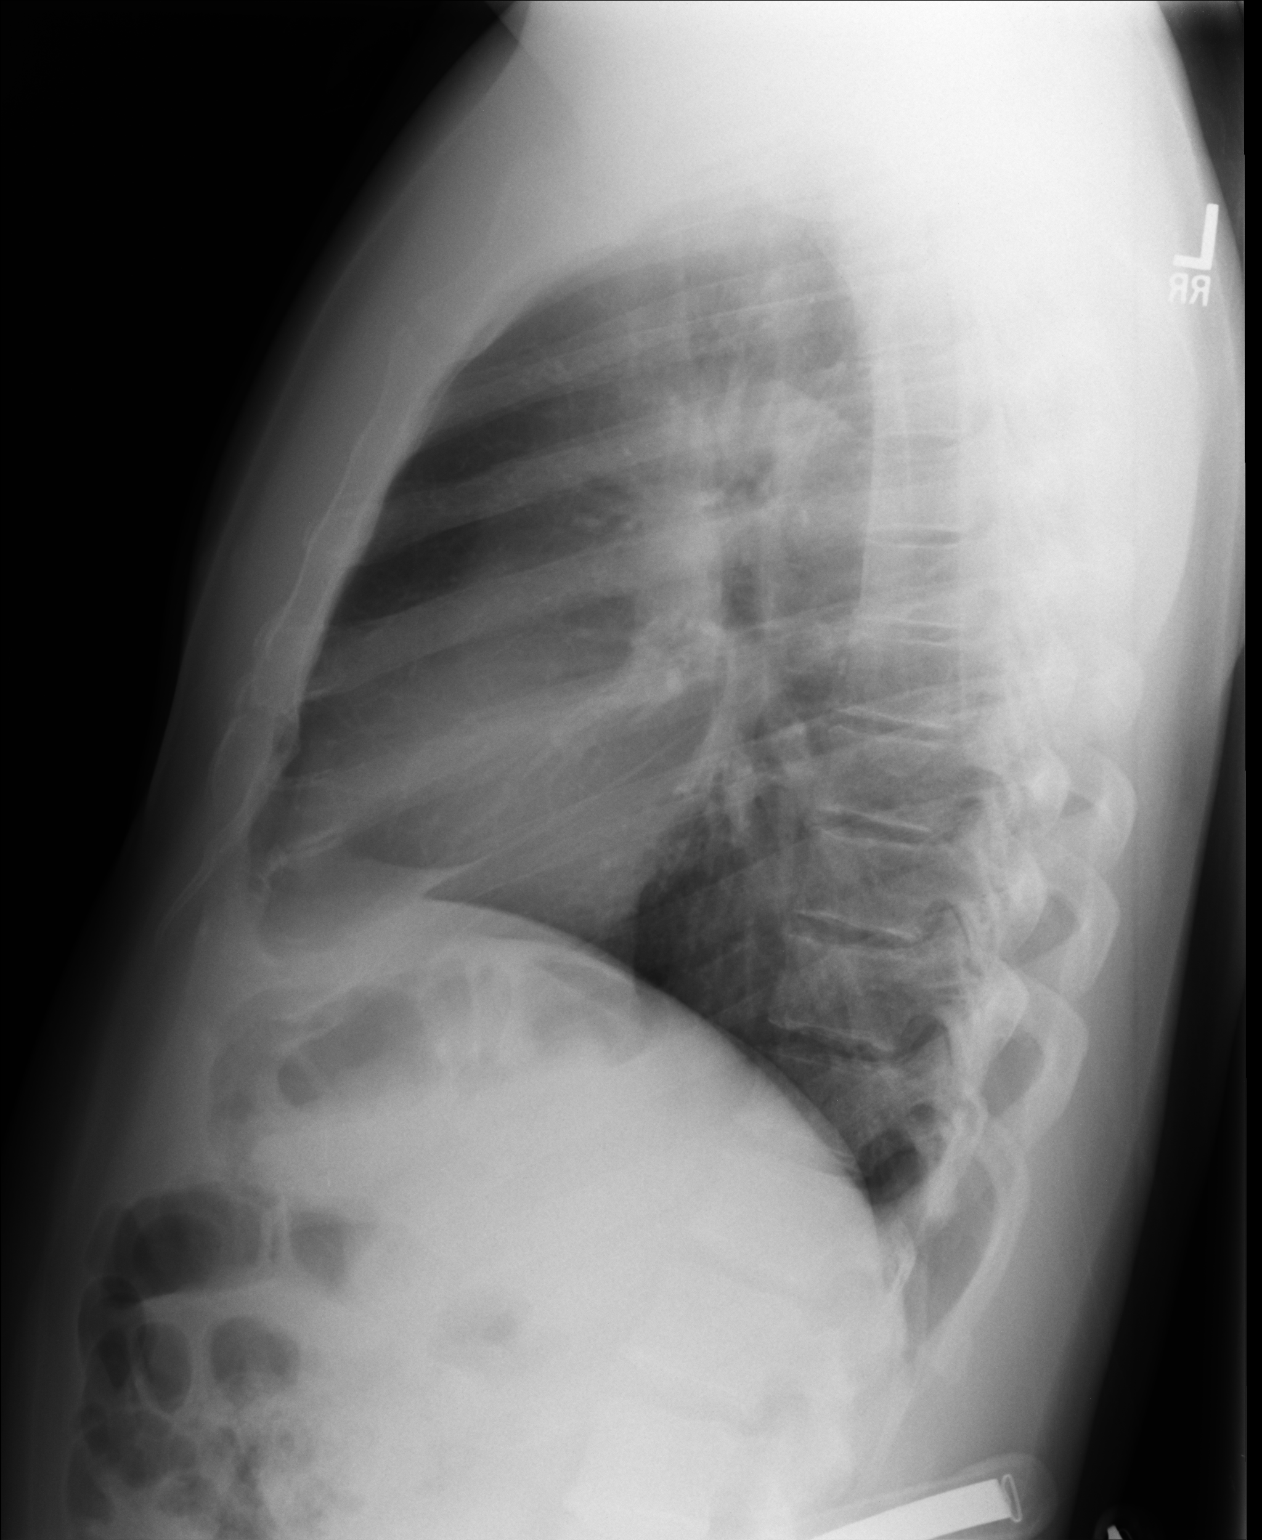

[2 of 2 positions shown; findings below may reference images not displayed]

FINDINGS: The heart size and mediastinal contours are within normal limits.
Both lungs are clear. The visualized skeletal structures are
unremarkable.
IMPRESSION: No active cardiopulmonary disease.

## 2014-05-21 MED ORDER — NEBIVOLOL HCL 5 MG PO TABS: 5.0000 mg | ORAL_TABLET | Freq: Every day | ORAL | Status: DC

## 2014-05-21 NOTE — Progress Notes (Addendum)
Subjective:    Patient ID: Jeffery Strickland, male    DOB: Sep 15, 1955, 59 y.o.   MRN: [id number] This chart was scribed for Jeffery StaggersJeffrey Mariajose Mow, MD by Jeffery Strickland, ED Scribe. The patient was seen in RM08. The patient's care was started at 9:33 AM.  Chief Complaint  Patient presents with  . possible indesgestion    chest pain,middle of chest,noticed it last night,but went away once he went to sleep     HPI  HPI Comments: Jeffery Strickland is a 59 y.o. male who presents to the [facility] complaining of chest pain that started last night at 9:30 PM while pt was on his computer talking with his son. He states that he initially became lightheaded/dizziness and felt himself start sweating. Pt notes that after that, he began to experience pain in the central part of his chest that radiated into his back that lasted about 5 minutes. Pt explains that he laid down, which alleviated pain before falling asleep. He denies that any neck pain or arm pain during onset. No complaints of chest pain/tightness this morning. Cardiologist is Dr. Royann Strickland. History of hypertension and hyperlipidemia for which he is on a statin. Last visit with Cardiologist was on the 19th, normal stress test in 2011 with echo and mild LVH and normal EF. Mother just had triple bypass surgery performed. Brother in his 1340's with PTCA.   Patient Active Problem List   Diagnosis Date Noted  . HTN (hypertension) 08/11/2012  . Hyperlipidemia 08/11/2012  . Hypertensive heart disease without CHF 08/11/2012  . Other ejaculatory dysfunction 08/11/2012   Past Medical History  Diagnosis Date  . Systemic hypertension   . Hyperlipemia    Past Surgical History  Procedure Laterality Date  . [place] echocardiography  03/22/2009    mild basal septal LV hypertropher,mild diastolic dysfx,consider early hypertrophic cardiomyopathy  . Nm myocar perf wall motion  01/11/2010    normal   No Known Allergies Prior to Admission medications   Medication Sig Start Date  End Date Taking? Authorizing Provider  amoxicillin (AMOXIL) 875 MG tablet Take 1 tablet (875 mg total) by mouth 2 (two) times daily. 05/04/14  Yes Jeffery SidleKurt Lauenstein, MD  aspirin 81 MG tablet Take 81 mg by mouth daily.   Yes Historical Provider, MD  nebivolol (BYSTOLIC) 10 MG tablet Take 1 tablet (10 mg total) by mouth daily. 04/24/14  Yes Jeffery Croitoru, MD  Olmesartan-Amlodipine-HCTZ (TRIBENZOR) 40-10-25 MG TABS Take 1/2 tablet daily. 03/06/14  Yes Jeffery MelenaBryan W Hager, PA-C  potassium chloride SA (K-DUR,KLOR-CON) 20 MEQ tablet Take 20 mEq by mouth 2 (two) times daily.   Yes Historical Provider, MD  rosuvastatin (CRESTOR) 10 MG tablet Take 10 mg by mouth daily.   Yes Historical Provider, MD   History   Social History  . Marital Status: Married    Spouse Name: N/A  . Number of Children: N/A  . Years of Education: N/A   Occupational History  . Not on file.   Social History Main Topics  . Smoking status: Never Smoker   . Smokeless tobacco: Never Used  . Alcohol Use: No  . Drug Use: No  . Sexual Activity: Not on file   Other Topics Concern  . Not on file   Social History Narrative     Review of Systems  Constitutional: Negative for fatigue and unexpected weight change.  Eyes: Negative for visual disturbance.  Respiratory: Negative for cough, chest tightness and shortness of breath.   Cardiovascular: Positive for chest pain. Negative  for palpitations and leg swelling.  Gastrointestinal: Negative for abdominal pain and blood in stool.  Musculoskeletal: Negative for neck pain.  Neurological: Positive for dizziness and light-headedness. Negative for headaches.       Objective:   Physical Exam  Constitutional: He is oriented to person, place, and time. He appears well-developed and well-nourished.  HENT:  Head: Normocephalic and atraumatic.  Eyes: EOM are normal. Pupils are equal, round, and reactive to light.  Neck: No JVD present. Carotid bruit is not present.  Cardiovascular: Normal  rate, regular rhythm and normal heart sounds.   No murmur heard. Pulmonary/Chest: Effort normal and breath sounds normal. He has no rales.  Musculoskeletal: He exhibits no edema.  Neurological: He is alert and oriented to person, place, and time.  Skin: Skin is warm and dry.  Psychiatric: He has a normal mood and affect.  Vitals reviewed.   Filed Vitals:   05/21/14 0839 05/21/14 0934  BP: 94/60 102/68  Pulse: 73   Temp: 98.1 F (36.7 C)   TempSrc: Oral   Resp: 20   Weight: 173 lb 12.8 oz (78.835 kg)   SpO2: 95%      9:44 AM- Treatment plan was discussed with patient who verbalizes understanding and agrees.   EKG: sinus bradycardia - rate 57, PR 172, Qtc 389.  Anterolateral negative T waves, appear unchanged from Feb 2016 at cardiologist.   Jeffery Strickland reading (PRIMARY) by  Dr. Neva SeatGreene: CXR: nad.   Discussed with cardiologist on call for his practice:         Assessment & Plan:   Jeffery Strickland is a 59 y.o. male Chest pain, unspecified chest pain type - Plan: EKG 12-Lead, DG Chest 2 View  - cardiac risk factors of age, HTN, hyperlipidemia and possible early CAD in brother.  Brief episode of substernal pain last night after possible hypotension episode/lightheadedness.   -asymptomatic now.  No apparent changes on EKG. D/w cardiology  -will continue ASA 81mg  QD, avoid exertional activity/exercise until evaluated by cardiology.   -ER/911 chest pain precautions given   Essential hypertension, Lightheadedness  - will continue same meds except lower dose of Bystolic with relative low BP in office, bradycardia on EKG, and lightheadedness last night.   -check home BP;s on lower dose of bystolic.  Change back to10mg  if elevating.   Meds ordered this encounter  Medications  . nebivolol (BYSTOLIC) 5 MG tablet    Sig: Take 1 tablet (5 mg total) by mouth daily.    Dispense:  30 tablet    Refill:  1   Patient Instructions  Your blood pressure is borderline low here today, and may have  been the reason you were lightheaded. For now, decrease dose of Bystolic to 5mg  once per day, Keep a record of your blood pressures outside of the office and bring them to the next office visit. If your blood pressure measures over 140/90 - return to previous dose of 10mg  Bystolic.     I personally performed the services described in this documentation, which was scribed in my presence. The recorded information has been reviewed and considered, and addended by me as needed.

## 2014-05-21 NOTE — Patient Instructions (Addendum)
Your blood pressure is borderline low here today, and may have been the reason you were lightheaded. For now, decrease dose of Bystolic to 5mg  once per day, Keep a record of your blood pressures outside of the office and bring them to the next office visit. If your blood pressure measures over 140/90 - return to previous dose of 10mg  Bystolic.   Your cardiologist's office will be calling you to scheduled follow up, but if chest pain returns - return here, emergency room, or call 911 if needed.   Return to the clinic or go to the nearest emergency room if any of your symptoms worsen or new symptoms occur.  Chest Pain (Nonspecific) It is often hard to give a specific diagnosis for the cause of chest pain. There is always a chance that your pain could be related to something serious, such as a heart attack or a blood clot in the lungs. You need to follow up with your health care provider for further evaluation. CAUSES   Heartburn.  Pneumonia or bronchitis.  Anxiety or stress.  Inflammation around your heart (pericarditis) or lung (pleuritis or pleurisy).  A blood clot in the lung.  A collapsed lung (pneumothorax). It can develop suddenly on its own (spontaneous pneumothorax) or from trauma to the chest.  Shingles infection (herpes zoster virus). The chest wall is composed of bones, muscles, and cartilage. Any of these can be the source of the pain.  The bones can be bruised by injury.  The muscles or cartilage can be strained by coughing or overwork.  The cartilage can be affected by inflammation and become sore (costochondritis). DIAGNOSIS  Lab tests or other studies may be needed to find the cause of your pain. Your health care provider may have you take a test called an ambulatory electrocardiogram (ECG). An ECG records your heartbeat patterns over a 24-hour period. You may also have other tests, such as:  Transthoracic echocardiogram (TTE). During echocardiography, sound waves are  used to evaluate how blood flows through your heart.  Transesophageal echocardiogram (TEE).  Cardiac monitoring. This allows your health care provider to monitor your heart rate and rhythm in real time.  Holter monitor. This is a portable device that records your heartbeat and can help diagnose heart arrhythmias. It allows your health care provider to track your heart activity for several days, if needed.  Stress tests by exercise or by giving medicine that makes the heart beat faster. TREATMENT   Treatment depends on what may be causing your chest pain. Treatment may include:  Acid blockers for heartburn.  Anti-inflammatory medicine.  Pain medicine for inflammatory conditions.  Antibiotics if an infection is present.  You may be advised to change lifestyle habits. This includes stopping smoking and avoiding alcohol, caffeine, and chocolate.  You may be advised to keep your head raised (elevated) when sleeping. This reduces the chance of acid going backward from your stomach into your esophagus. Most of the time, nonspecific chest pain will improve within 2-3 days with rest and mild pain medicine.  HOME CARE INSTRUCTIONS   If antibiotics were prescribed, take them as directed. Finish them even if you start to feel better.  For the next few days, avoid physical activities that bring on chest pain. Continue physical activities as directed.  Do not use any tobacco products, including cigarettes, chewing tobacco, or electronic cigarettes.  Avoid drinking alcohol.  Only take medicine as directed by your health care provider.  Follow your health care provider's suggestions for  further testing if your chest pain does not go away.  Keep any follow-up appointments you made. If you do not go to an appointment, you could develop lasting (chronic) problems with pain. If there is any problem keeping an appointment, call to reschedule. SEEK MEDICAL CARE IF:   Your chest pain does not go  away, even after treatment.  You have a rash with blisters on your chest.  You have a fever. SEEK IMMEDIATE MEDICAL CARE IF:   You have increased chest pain or pain that spreads to your arm, neck, jaw, back, or abdomen.  You have shortness of breath.  You have an increasing cough, or you cough up blood.  You have severe back or abdominal pain.  You feel nauseous or vomit.  You have severe weakness.  You faint.  You have chills. This is an emergency. Do not wait to see if the pain will go away. Get medical help at once. Call your local emergency services (911 in [place] Do not drive yourself to the hospital. MAKE SURE YOU:   Understand these instructions.  Will watch your condition.  Will get help right away if you are not doing well or get worse. Document Released: 10/12/2004 Document Revised: 01/07/2013 Document Reviewed: 08/08/2007 [facility] Patient Information 2015 [employer] [place] [place]. This information is not intended to replace advice given to you by your health care provider. Make sure you discuss any questions you have with your health care provider.

## 2014-05-21 NOTE — Telephone Encounter (Signed)
Dr Chilton SiGreen spoke to Dr Herbie BaltimoreHarding  Per Dr Herbie BaltimoreHARDING, SCHEDULE APPOINTMENT FOR PATIENT WITH DR CROITORU OR BRYAN HAGER PA ( [facility])

## 2014-05-22 ENCOUNTER — Ambulatory Visit: Payer: BLUE CROSS/BLUE SHIELD | Admitting: Cardiovascular Disease

## 2014-07-03 ENCOUNTER — Ambulatory Visit (INDEPENDENT_AMBULATORY_CARE_PROVIDER_SITE_OTHER): Payer: BLUE CROSS/BLUE SHIELD | Admitting: Cardiovascular Disease

## 2014-07-03 ENCOUNTER — Encounter: Payer: Self-pay | Admitting: Cardiovascular Disease

## 2014-07-03 VITALS — BP 104/74 | HR 76 | Ht 67.0 in | Wt 173.3 lb

## 2014-07-03 DIAGNOSIS — R0789 Other chest pain: Secondary | ICD-10-CM

## 2014-07-03 DIAGNOSIS — R079 Chest pain, unspecified: Secondary | ICD-10-CM

## 2014-07-03 MED ORDER — NEBIVOLOL HCL 2.5 MG PO TABS: 2.5000 mg | ORAL_TABLET | Freq: Every day | ORAL | Status: DC

## 2014-07-03 NOTE — Patient Instructions (Addendum)
Medication Instructions:   DECREASE Bystolic to 2.5mg  a day.  Labwork:  none  Testing/Procedures:   Your physician has requested that you have en exercise stress myoview. For further information please visit [email or web address] Please follow instruction sheet, as given.    Follow-Up:  6 Months.  Any Other Special Instructions Will Be Listed Below (If Applicable).

## 2014-07-03 NOTE — Progress Notes (Signed)
Patient ID: Jeffery Strickland, male   DOB: [birth year], 59 y.o.   MRN: [id number]      Cardiology Office Note   Date:  07/03/2014   ID:  Jeffery Strickland, DOB 21-Dec-1955, MRN [id number]  PCP:  Jeffery Pitter, MD  Cardiologist:   Jeffery Fair, MD   Chief Complaint  Patient presents with  . Follow-up     Urgent care followup-thinks he had bad case of indgestion;no chest pain, no shortness of breath, no edema, no pain in legs, no cramping in legs, no lightheadedness, no dizziness      History of Present Illness: Jeffery Strickland is a 59 y.o. male who presents for follow-up for hypertensive heart disease and a recent episode of "indigestion. He was sitting at his computer when he felt sudden squeezing heaviness in his lower retrosternal area that persisted for a long time. It did not seem to worsen when he moved around or change position. It does not appear to be associated with a meal. He went to bed early and woke up in the morning feeling better, but once to urgent care the next day at his wife's insistence. The electrocardiogram is chronically abnormal due to left ventricular hypertrophy and did not show any changes from prior tracings.  He has stopped exercising following these events. This morning he had another episode of "indigestion". This began just above his umbilicus and moved upwards to his midchest and then back down to his abdomen. It is now completely gone. His dose of anti-hypertensive medications was reduced in February for dizziness and low readings. His blood pressure remains relatively low today.  He has long-standing systemic hypertension requiring multiple agents for control and has treated hyperlipidemia. He has made efforts to improve his diet and despite not losing significant weight is requiring less antihypertensives than he did a couple of years ago. Nevertheless he remains on 4 different agents at this time. Lipid profile was recently reviewed by Dr. Parke Strickland (I don't yet have  those results).  In 2011 he had a normal nuclear stress test and an echocardiogram that showed left ventricular hypertrophy and preserved left ventricular systolic function and mild diastolic dysfunction.  Past Medical History  Diagnosis Date  . Systemic hypertension   . Hyperlipemia     Past Surgical History  Procedure Laterality Date  . 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 Nm myocar perf wall motion  01/11/2010    normal     Current Outpatient Prescriptions  Medication Sig Dispense Refill  . aspirin 81 MG tablet Take 81 mg by mouth daily.    . nebivolol (BYSTOLIC) 2.5 MG tablet Take 1 tablet (2.5 mg total) by mouth daily. 30 tablet 6  . potassium chloride SA (K-DUR,KLOR-CON) 20 MEQ tablet Take 20 mEq by mouth 2 (two) times daily.    . rosuvastatin (CRESTOR) 10 MG tablet Take 10 mg by mouth daily.    Jeffery Strickland 40-10-12.5 MG TABS Take 1 tablet by mouth daily. Take 1 tab daily     No current facility-administered medications for this visit.    Allergies:   Review of patient's allergies indicates no known allergies.    Social History:  The patient  reports that he has never smoked. He has never used smokeless tobacco. He reports that he does not drink alcohol or use illicit drugs.   Family History:  The patient's family history includes Cancer in his sister; Diabetes in his sister; Heart  attack in his father.    ROS:  Please see the history of present illness.    Otherwise, review of systems positive for none.   All other systems are reviewed and negative.    PHYSICAL EXAM: VS:  BP 104/74 mmHg  Pulse 76  Ht  (1.702 m)  Wt 173 lb 4.8 oz (78.608 kg)  BMI 27.14 kg/m2 , BMI Body mass index is 27.14 kg/(m^2).  General: Alert, oriented x3, no distress Head: no evidence of trauma, PERRL, EOMI, no exophtalmos or lid lag, no myxedema, no xanthelasma; normal ears, nose and  oropharynx Neck: normal jugular venous pulsations and no hepatojugular reflux; brisk carotid pulses without delay and no carotid bruits Chest: clear to auscultation, no signs of consolidation by percussion or palpation, normal fremitus, symmetrical and full respiratory excursions Cardiovascular: normal position and quality of the apical impulse, regular rhythm, normal first and second heart sounds, no murmurs, rubs or gallops Abdomen: no tenderness or distention, no masses by palpation, no abnormal pulsatility or arterial bruits, normal bowel sounds, no hepatosplenomegaly Extremities: no clubbing, cyanosis or edema; 2+ radial, ulnar and brachial pulses bilaterally; 2+ right femoral, posterior tibial and dorsalis pedis pulses; 2+ left femoral, posterior tibial and dorsalis pedis pulses; no subclavian or femoral bruits Neurological: grossly nonfocal Psych: euthymic mood, full affect   EKG:  EKG is not ordered today. The ekg ordered in May demonstrates sinus rhythm and left ventricular hypertrophy with secondary repolarization changes   Recent Labs: 11/22/2013: ALT 23; BUN 21; Creat 1.55*; Hemoglobin 15.9; Potassium 4.3; Sodium 139; TSH 1.623    Lipid Panel No results found for: CHOL, TRIG, HDL, CHOLHDL, VLDL, LDLCALC, LDLDIRECT    Wt Readings from Last 3 Encounters:  07/03/14 173 lb 4.8 oz (78.608 kg)  05/21/14 173 lb 12.8 oz (78.835 kg)  05/04/14 169 lb 8 oz (76.885 kg)     ASSESSMENT AND PLAN:  Jeffery Strickland has numerous risk factors for coronary artery disease including long-standing systemic hypertension, hyperlipidemia and male gender. He has had recent episodes of "indigestion" that could be angina pectoris, albeit atypical. Unfortunately his electrocardiogram shows significant baseline abnormalities, which makes conventional stress testing nonspecific. Will recommend that he have a repeat treadmill nuclear perfusion study. Also retrieve his most recent labs from Dr. Parke Strickland. His blood  pressure remains low and I have recommended that he further reduce his beta blocker to 2.5 mg daily. He should not take his beta blocker at all on the day of his stress test.   Current medicines are reviewed at length with the patient today.  The patient does not have concerns regarding medicines.  The following changes have been made:  Reduced bystolic to 2.5 mg daily  Labs/ tests ordered today include:  Orders Placed This Encounter  Procedures  . Myocardial Perfusion Imaging    Patient Instructions  Medication Instructions:   DECREASE Bystolic to 2.5mg  a day.  Labwork:  none  Testing/Procedures:   Your physician has requested that you have en exercise stress myoview. For further information please visit [email or web address] Please follow instruction sheet, as given.    Follow-Up:  6 Months.  Any Other Special Instructions Will Be Listed Below (If Applicable).       Joie Bimler, MD  07/03/2014 5:58 PM    Jeffery Fair, MD, [facility] [phone] office [phone] pager

## 2014-07-17 ENCOUNTER — Encounter (HOSPITAL_COMMUNITY): Payer: BLUE CROSS/BLUE SHIELD

## 2014-07-22 ENCOUNTER — Telehealth (HOSPITAL_COMMUNITY): Payer: Self-pay

## 2014-07-22 NOTE — Telephone Encounter (Signed)
Encounter complete. 

## 2014-07-23 ENCOUNTER — Telehealth (HOSPITAL_COMMUNITY): Payer: Self-pay

## 2014-07-23 NOTE — Telephone Encounter (Signed)
Encounter complete. 

## 2014-07-24 ENCOUNTER — Ambulatory Visit (HOSPITAL_COMMUNITY)
Admission: RE | Admit: 2014-07-24 | Discharge: 2014-07-24 | Disposition: A | Discharge status: Home / Self Care | Payer: BLUE CROSS/BLUE SHIELD | Source: Ambulatory Visit | Attending: Cardiology | Admitting: Cardiology

## 2014-07-24 DIAGNOSIS — I119 Hypertensive heart disease without heart failure: Secondary | ICD-10-CM | POA: Diagnosis not present

## 2014-07-24 DIAGNOSIS — R079 Chest pain, unspecified: Secondary | ICD-10-CM | POA: Insufficient documentation

## 2014-07-24 DIAGNOSIS — Z8249 Family history of ischemic heart disease and other diseases of the circulatory system: Secondary | ICD-10-CM | POA: Diagnosis not present

## 2014-07-24 LAB — MYOCARDIAL PERFUSION IMAGING
CHL CUP NUCLEAR SDS: 1
CHL RATE OF PERCEIVED EXERTION: 15
CSEPED: 8 min
CSEPHR: 90 %
Estimated workload: 10.1 METS
LVDIAVOL: 90 mL
LVSYSVOL: 33 mL
MPHR: 162 {beats}/min
Peak HR: 146 {beats}/min
Rest HR: 71 {beats}/min
SRS: 0
SSS: 1
TID: 0.89

## 2014-07-24 IMAGING — NM NM MISC PROCEDURE
6 series · 36 of 36 positions shown · non-contrast
Comparison: none

[Series 1: wbr_r-proj_st wbr rest · 6.40mm/px · 6 of 64 frames shown]
[frame 6/64]
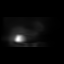
[frame 16/64]
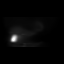
[frame 27/64]
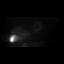
[frame 38/64]
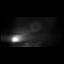
[frame 48/64]
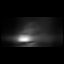
[frame 59/64]
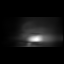

[Series 1: wbr rest · 6.40mm/px · 6 of 64 frames shown]
[frame 6/64]
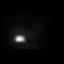
[frame 16/64]
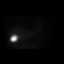
[frame 27/64]
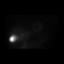
[frame 38/64]
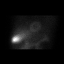
[frame 48/64]
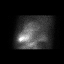
[frame 59/64]
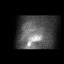

[Series 2: wbr stress-gsp · 6.40mm/px · 6 of 512 frames shown]
[frame 43/512]
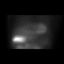
[frame 128/512]
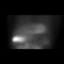
[frame 214/512]
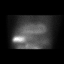
[frame 299/512]
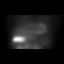
[frame 384/512]
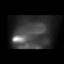
[frame 470/512]
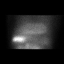

[Series 2: wbr_s-proj_st wbr stress-gsp · 6.40mm/px · 6 of 512 frames shown]
[frame 43/512]
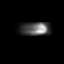
[frame 128/512]
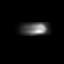
[frame 214/512]
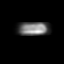
[frame 299/512]
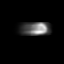
[frame 384/512]
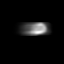
[frame 470/512]
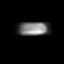

[Series 3: wbr_s-proj_st wbr stress-sum-em · 6.40mm/px · 6 of 64 frames shown]
[frame 6/64]
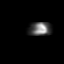
[frame 16/64]
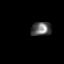
[frame 27/64]
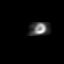
[frame 38/64]
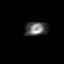
[frame 48/64]
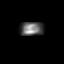
[frame 59/64]
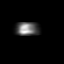

[Series 3: wbr stress-sum-em · 6.40mm/px · 6 of 64 frames shown]
[frame 6/64]
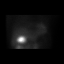
[frame 16/64]
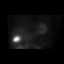
[frame 27/64]
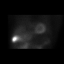
[frame 38/64]
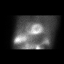
[frame 48/64]
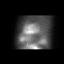
[frame 59/64]
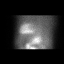

[36 of 36 positions shown; findings below may reference images not displayed]

Canned report from images found in remote index.

Refer to host system for actual result text.

## 2014-07-24 MED ORDER — TECHNETIUM TC 99M SESTAMIBI GENERIC - CARDIOLITE
31.2000 | Freq: Once | INTRAVENOUS | Status: AC | PRN
  Administered 2014-07-24: 31.2 via INTRAVENOUS

## 2014-07-24 MED ORDER — TECHNETIUM TC 99M SESTAMIBI GENERIC - CARDIOLITE
10.8000 | Freq: Once | INTRAVENOUS | Status: AC | PRN
Start: 2014-07-24 — End: 2014-07-24
  Administered 2014-07-24: 11 via INTRAVENOUS

## 2015-02-17 ENCOUNTER — Telehealth: Payer: Self-pay | Admitting: Cardiovascular Disease

## 2015-02-17 NOTE — Telephone Encounter (Signed)
Agree 

## 2015-02-17 NOTE — Telephone Encounter (Signed)
Pt's wife called in stating that the pt is complaining of chest pain coming and going and severe sweating. The pt would like to be seen ..please advise   Thanks

## 2015-02-17 NOTE — Telephone Encounter (Signed)
Phone rings with no answer, no VM pickup.  Spoke to wife on listed mobile.  States pt c/o intermittent chest pain x2 weeks. This AM he began to have diaphoresis, dizziness associated w/ this. Advised ED evaluation. Wife voiced understanding.

## 2015-02-18 NOTE — Progress Notes (Signed)
Cardiology Office Note:    Date:  02/19/2015   ID:  Jeffery Strickland, DOB 22-Apr-1955, MRN [id number]  PCP:  Geraldo Pitter, MD  Cardiologist:  Dr. [name]   Electrophysiologist:  n/a  Chief Complaint  Patient presents with  . Abnormal ECG    consult - ref by Dr. Loleta Chance     History of Present Illness:     Jeffery Strickland is a 60 y.o. 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  Last seen by Dr. Royann Shivers 6/16. He had been seen in urgent care with chest discomfort described as indigestion. Blood pressure was running low and his Bystolic was reduced. Nuclear stress test was arranged and was low risk with normal perfusion.  The patient was at church 3 weeks ago at a Bible study. While seated, he suddenly became diaphoretic. He may have been somewhat lightheaded. He became anxious. This all went on for 5-10 minutes. He got up and went outside and felt better. He had no pain associated. He had no associated dyspnea, palpitations, near-syncope or syncope, nausea. He denies a history of syncope. He denies exertional chest discomfort or dyspnea. He denies decreased exercise tolerance. Denies orthopnea, PND or edema. He did have a couple of episodes of brief (1-2 second) stinging type chest pain this past week at work. This was preceded by a "hot flash."  He was seen by Dr. Loleta Chance at his primary care physician's office. Labs were obtained. The patient tells me that his testosterone was low and his renal function was stable. He tells me that his wife has noted snoring and apneic episodes in the past. He was asked by Dr. Loleta Chance to follow-up with cardiology.   Past Medical History  Diagnosis Date  . Hypertensive heart disease   . Hyperlipemia   . History of nuclear stress test     Myoview 7/16:  EF 64%, normal perfusion, low risk  . History of echocardiogram     Echo 3/11:  Normal LV function, mild basal septal hypertrophy, mild diastolic dysfunction, no significant  valvular abnormalities  . CKD (chronic kidney disease)     Past Surgical History  Procedure Laterality Date  . 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 Nm myocar perf wall motion  01/11/2010    normal    Current Medications: Outpatient Prescriptions Prior to Visit  Medication Sig Dispense Refill  . aspirin 81 MG tablet Take 81 mg by mouth daily.    . nebivolol (BYSTOLIC) 2.5 MG tablet Take 1 tablet (2.5 mg total) by mouth daily. 30 tablet 6  . rosuvastatin (CRESTOR) 10 MG tablet Take 10 mg by mouth daily.    Marya Landry 40-10-12.5 MG TABS Take 1 tablet by mouth daily. Take 1 tab daily    . potassium chloride SA (K-DUR,KLOR-CON) 20 MEQ tablet Take 20 mEq by mouth 2 (two) times daily. Reported on 02/19/2015     No facility-administered medications prior to visit.     Allergies:   Review of patient's allergies indicates no known allergies.   Social History   Social History  . Marital Status: Married    Spouse Name: N/A  . Number of Children: N/A  . Years of Education: N/A   Occupational History  . 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 Smoking status: Never Smoker   .  Smokeless tobacco: Never Used  . Alcohol Use: No  . Drug Use: No  . Sexual Activity: Not Asked   Other Topics Concern  . None   Social History Narrative     Family History:  The patient's family history includes Cancer in his sister; Diabetes in his sister; Heart attack in his father.   ROS:   Please see the history of present illness.    Review of Systems  Constitution: Positive for diaphoresis.  Cardiovascular: Positive for chest pain.  All other systems reviewed and are negative.   Physical Exam:    VS:  BP 110/70 mmHg  Pulse 72  Ht 5\' 7"  (1.702 m)  Wt 177 lb 1.9 oz (80.341 kg)  BMI 27.73 kg/m2   GEN: Well nourished, well developed, in no acute  distress HEENT: normal Neck: no JVD, no masses Cardiac: Normal S1/S2, RRR; no murmurs,   no edema;  no carotid bruits,   Respiratory:  clear to auscultation bilaterally; no wheezing, rhonchi or rales GI: soft, nontender  MS: no deformity or atrophy Skin: warm and dry, no rash Neuro:  no focal deficits  Psych: Alert and oriented x 3, normal affect  Wt Readings from Last 3 Encounters:  02/19/15 177 lb 1.9 oz (80.341 kg)  07/24/14 173 lb (78.472 kg)  07/03/14 173 lb 4.8 oz (78.608 kg)      Studies/Labs Reviewed:     EKG:  EKG is  ordered today.  The ekg ordered today demonstrates NSR, HR 73, normal axis, septal Q waves, T-wave inversions in 2, 3, aVF, V5-V6, QTc 405 ms, no change when compared to prior tracings  Recent Labs: No results found for requested labs within last 365 days.   Recent Lipid Panel No results found for: CHOL, TRIG, HDL, CHOLHDL, VLDL, LDLCALC, LDLDIRECT  Additional studies/ records that were reviewed today include:   Myoview 07/24/14 EF 64%, normal perfusion, low risk  Echo 03/22/09 Normal LV function, mild basal septal hypertrophy, mild diastolic dysfunction, no significant valvular abnormalities   ASSESSMENT:     1. Chest discomfort   2. Dizziness   3. Hypertensive heart disease without CHF   4. Hyperlipidemia   5. Snoring   6. CKD (chronic kidney disease), unspecified stage     PLAN:     In order of problems listed above:  1. Chest Pain - His chest symptoms are quite atypical for ischemia. His ECG has chronically been abnormal. There have been no changes noted. He denies exertional symptoms to suggest angina. Stress testing in 7/16 was low risk. At this point, he does not require further ischemic testing. We discussed the warning signs of ischemic heart disease and when he should return for sooner follow-up. Reassurance provided today. Otherwise follow-up with Dr. Royann Shivers in 3 months.    -  Echocardiogram.  2. Dizziness - Recent episode of  diaphoresis and lightheadedness.  He tells me that his sugar was low when he went to his PCPs office. Question if this episode was prompted by hypoglycemia. He had a separate episode back in the summer of 2016. Arrhythmogenic cause is doubted. I will screen with a Holter to r/o significant arrhythmias.  It has been 6 years since his last Echo.  I will repeat his Echo to reassess LVH, EF.  -  Holter x 24 hours  -  Echocardiogram  3. HTN Heart Disease - BP controlled.  Obtain Echo as noted.  Continue current Rx.   4. HL - Continue statin.  5. Snoring - He likely has OSA by his description.  Will obtain split night sleep study.  6. CKD - Creatinine was stable per his report.  Will get recent labs.      Medication Adjustments/Labs and Tests Ordered: Current medicines are reviewed at length with the patient today.  Concerns regarding medicines are outlined above.  Medication changes, Labs and Tests ordered today are outlined in the Patient Instructions noted below. Patient Instructions  Medication Instructions:  Your physician recommends that you continue on your current medications as directed. Please refer to the Current Medication list given to you today.  Labwork: NONE  Testing/Procedures: 1. Your physician has requested that you have an echocardiogram. Echocardiography is a painless test that uses sound waves to create images of your heart. It provides your doctor with information about the size and shape of your heart and how well your heart's chambers and valves are working. This procedure takes approximately one hour. There are no restrictions for this procedure.  2. Your physician has recommended that you wear a 24 HOURholter monitor. Holter monitors are medical devices that record the heart's electrical activity. Doctors most often use these monitors to diagnose arrhythmias. Arrhythmias are problems with the speed or rhythm of the heartbeat. The monitor is a small, portable device.  You can wear one while you do your normal daily activities. This is usually used to diagnose what is causing palpitations/syncope (passing out).  3. Your physician has recommended that you have a SPLIT NIGHT sleep study. This test records several body functions during sleep, including: brain activity, eye movement, oxygen and carbon dioxide blood levels, heart rate and rhythm, breathing rate and rhythm, the flow of air through your mouth and nose, snoring, body muscle movements, and chest and belly movement.  Follow-Up: DR. Royann Shivers IN 3 MONTHS  Any Other Special Instructions Will Be Listed Below (If Applicable).  If you need a refill on your cardiac medications before your next appointment, please call your pharmacy.     Signed, Tereso Newcomer, PA-C  02/19/2015 10:13 AM    [facility] [address] [place], [place], [place]  [address] Phone: [phone]; Fax: [phone]

## 2015-02-19 ENCOUNTER — Ambulatory Visit (INDEPENDENT_AMBULATORY_CARE_PROVIDER_SITE_OTHER): Payer: BC Managed Care – PPO | Admitting: Physician Assistant

## 2015-02-19 ENCOUNTER — Encounter: Payer: Self-pay | Admitting: Physician Assistant

## 2015-02-19 VITALS — BP 110/70 | HR 72 | Ht 67.0 in | Wt 177.1 lb

## 2015-02-19 DIAGNOSIS — R0683 Snoring: Secondary | ICD-10-CM

## 2015-02-19 DIAGNOSIS — R0789 Other chest pain: Secondary | ICD-10-CM | POA: Diagnosis not present

## 2015-02-19 DIAGNOSIS — I119 Hypertensive heart disease without heart failure: Secondary | ICD-10-CM | POA: Diagnosis not present

## 2015-02-19 DIAGNOSIS — N189 Chronic kidney disease, unspecified: Secondary | ICD-10-CM

## 2015-02-19 DIAGNOSIS — E785 Hyperlipidemia, unspecified: Secondary | ICD-10-CM | POA: Diagnosis not present

## 2015-02-19 DIAGNOSIS — R42 Dizziness and giddiness: Secondary | ICD-10-CM | POA: Diagnosis not present

## 2015-02-19 NOTE — Patient Instructions (Addendum)
Medication Instructions:  Your physician recommends that you continue on your current medications as directed. Please refer to the Current Medication list given to you today.  Labwork: NONE  Testing/Procedures: 1. Your physician has requested that you have an echocardiogram. Echocardiography is a painless test that uses sound waves to create images of your heart. It provides your doctor with information about the size and shape of your heart and how well your heart's chambers and valves are working. This procedure takes approximately one hour. There are no restrictions for this procedure.  2. Your physician has recommended that you wear a 24 HOURholter monitor. Holter monitors are medical devices that record the heart's electrical activity. Doctors most often use these monitors to diagnose arrhythmias. Arrhythmias are problems with the speed or rhythm of the heartbeat. The monitor is a small, portable device. You can wear one while you do your normal daily activities. This is usually used to diagnose what is causing palpitations/syncope (passing out).  3. Your physician has recommended that you have a SPLIT NIGHT sleep study. This test records several body functions during sleep, including: brain activity, eye movement, oxygen and carbon dioxide blood levels, heart rate and rhythm, breathing rate and rhythm, the flow of air through your mouth and nose, snoring, body muscle movements, and chest and belly movement.  Follow-Up: DR. Royann Shivers IN 3 MONTHS  Any Other Special Instructions Will Be Listed Below (If Applicable).  If you need a refill on your cardiac medications before your next appointment, please call your pharmacy.

## 2015-02-23 ENCOUNTER — Ambulatory Visit (INDEPENDENT_AMBULATORY_CARE_PROVIDER_SITE_OTHER): Payer: BC Managed Care – PPO

## 2015-02-23 DIAGNOSIS — R42 Dizziness and giddiness: Secondary | ICD-10-CM | POA: Diagnosis not present

## 2015-03-02 ENCOUNTER — Ambulatory Visit (HOSPITAL_COMMUNITY): Payer: BC Managed Care – PPO | Attending: Cardiology

## 2015-03-02 ENCOUNTER — Other Ambulatory Visit: Payer: Self-pay

## 2015-03-02 DIAGNOSIS — Z8249 Family history of ischemic heart disease and other diseases of the circulatory system: Secondary | ICD-10-CM | POA: Insufficient documentation

## 2015-03-02 DIAGNOSIS — I071 Rheumatic tricuspid insufficiency: Secondary | ICD-10-CM | POA: Diagnosis not present

## 2015-03-02 DIAGNOSIS — R0789 Other chest pain: Secondary | ICD-10-CM | POA: Insufficient documentation

## 2015-03-02 DIAGNOSIS — E785 Hyperlipidemia, unspecified: Secondary | ICD-10-CM | POA: Insufficient documentation

## 2015-03-02 DIAGNOSIS — R42 Dizziness and giddiness: Secondary | ICD-10-CM | POA: Diagnosis not present

## 2015-03-02 DIAGNOSIS — R079 Chest pain, unspecified: Secondary | ICD-10-CM | POA: Diagnosis present

## 2015-03-02 DIAGNOSIS — I129 Hypertensive chronic kidney disease with stage 1 through stage 4 chronic kidney disease, or unspecified chronic kidney disease: Secondary | ICD-10-CM | POA: Diagnosis not present

## 2015-03-02 DIAGNOSIS — N189 Chronic kidney disease, unspecified: Secondary | ICD-10-CM | POA: Diagnosis not present

## 2015-03-02 DIAGNOSIS — I059 Rheumatic mitral valve disease, unspecified: Secondary | ICD-10-CM | POA: Diagnosis not present

## 2015-03-02 DIAGNOSIS — I119 Hypertensive heart disease without heart failure: Secondary | ICD-10-CM | POA: Diagnosis not present

## 2015-03-02 DIAGNOSIS — I340 Nonrheumatic mitral (valve) insufficiency: Secondary | ICD-10-CM | POA: Insufficient documentation

## 2015-03-02 DIAGNOSIS — I5189 Other ill-defined heart diseases: Secondary | ICD-10-CM | POA: Insufficient documentation

## 2015-03-03 ENCOUNTER — Telehealth: Payer: Self-pay | Admitting: *Deleted

## 2015-03-03 ENCOUNTER — Encounter: Payer: Self-pay | Admitting: Physician Assistant

## 2015-03-03 NOTE — Telephone Encounter (Signed)
Pt has been notified of echo results by phone with verbal understanding 

## 2015-05-02 ENCOUNTER — Ambulatory Visit (HOSPITAL_BASED_OUTPATIENT_CLINIC_OR_DEPARTMENT_OTHER): Payer: BC Managed Care – PPO | Attending: Physician Assistant

## 2015-05-02 VITALS — Ht 67.0 in | Wt 175.0 lb

## 2015-05-02 DIAGNOSIS — G4733 Obstructive sleep apnea (adult) (pediatric): Secondary | ICD-10-CM

## 2015-05-02 DIAGNOSIS — Z7982 Long term (current) use of aspirin: Secondary | ICD-10-CM | POA: Insufficient documentation

## 2015-05-02 DIAGNOSIS — R0683 Snoring: Secondary | ICD-10-CM | POA: Diagnosis not present

## 2015-05-02 HISTORY — PX: NOCTURNAL POLYSOMNOGRAPHY WITH SEIZURE MONTAGE: SLE1007

## 2015-05-02 HISTORY — PX: SPLIT NIGHT STUDY: SLE1000

## 2015-05-09 ENCOUNTER — Telehealth: Payer: Self-pay | Admitting: Cardiology

## 2015-05-09 ENCOUNTER — Encounter (HOSPITAL_BASED_OUTPATIENT_CLINIC_OR_DEPARTMENT_OTHER): Payer: Self-pay

## 2015-05-09 DIAGNOSIS — G4733 Obstructive sleep apnea (adult) (pediatric): Secondary | ICD-10-CM

## 2015-05-09 HISTORY — DX: Obstructive sleep apnea (adult) (pediatric): G47.33

## 2015-05-09 NOTE — Procedures (Addendum)
Patient Name: Jeffery Strickland, Jeffery Strickland MRN: [id number] Study Date: 05/02/2015 Gender: Male D.O.B: March 22, 1955 Age (years): 32 Referring Provider: Richardson Dopp Interpreting Physician: Fransico Him MD, ABSM RPSGT: Madelon Lips  Weight (lbs): 170 Height (inches): 67 BMI: 27 Neck Size: 16.00  CLINICAL INFORMATION Sleep Study Type: Split Night CPAP Indication for sleep study: Snoring Epworth Sleepiness Score: 5  SLEEP STUDY TECHNIQUE As per the AASM Manual for the Scoring of Sleep and Associated Events v2.3 (April 2016) with a hypopnea requiring 4% desaturations. The channels recorded and monitored were frontal, central and occipital EEG, electrooculogram (EOG), submentalis EMG (chin), nasal and oral airflow, thoracic and abdominal wall motion, anterior tibialis EMG, snore microphone, electrocardiogram, and pulse oximetry. Continuous positive airway pressure (CPAP) was initiated when the patient met split night criteria and was titrated according to treat sleep-disordered breathing.  MEDICATIONS Medications taken by the patient : ASA, Bystolic, Crestor, Aldactone, Tribenzor Medications administered by patient during sleep study : No sleep medicine administered.  RESPIRATORY PARAMETERS Diagnostic Total AHI (/hr): 48.6   RDI (/hr):51.7   OA Index (/hr): 4.8  CA Index (/hr): 1.3 REM AHI (/hr): 66.3   NREM AHI (/hr):42.9  Supine AHI (/hr):N/A  Non-supine AHI (/hr):48.62 Min O2 Sat (%):64.00   Mean O2 (%):91.79  Time below 88% (min):24.7    Titration Optimal Pressure (cm):7  AHI at Optimal Pressure (/hr):2.5 Min O2 at Optimal Pressure (%):91.0 Supine % at Optimal (%):16  Sleep % at Optimal (%):83    SLEEP ARCHITECTURE The recording time for the entire night was 377.1 minutes. During a baseline period of 177.9 minutes, the patient slept for 137.0 minutes in REM and nonREM, yielding a reduced sleep efficiency of 77.0%. Sleep onset after lights out was 29.4 minutes with a REM latency of 96.5  minutes. The patient spent 36.12% of the night in stage N1 sleep, 39.43% in stage N2 sleep, 0.00% in stage N3 and 24.46% in REM.   During the titration period of 193.5 minutes, the patient slept for 134.7 minutes in REM and nonREM, yielding a reduced sleep efficiency of 69.6%. Sleep onset after CPAP initiation was 12.3 minutes with a REM latency of 77.0 minutes. The patient spent 24.49% of the night in stage N1 sleep, 65.49% in stage N2 sleep, 0.00% in stage N3 and 10.02% in REM.  CARDIAC DATA The 2 lead EKG demonstrated sinus rhythm. The mean heart rate was 64.15 beats per minute. Other EKG findings include: None.  LEG MOVEMENT DATA The total Periodic Limb Movements of Sleep (PLMS) were 0. The PLMS index was 0.00 .  IMPRESSIONS - Severe obstructive sleep apnea occurred during the diagnostic portion of the study (AHI = 48.6/hour). An optimal PAP pressure was selected for this patient ( 7 cm of water) - No significant central sleep apnea occurred during the diagnostic portion of the study (CAI = 1.3/hour). - The patient had severe oxygen desaturation during the diagnostic portion of the study (Min O2 = 64.00%) - The patient snored with Soft snoring volume during the diagnostic portion of the study. - No cardiac abnormalities were noted during this study. - Clinically significant periodic limb movements did not occur during sleep.  DIAGNOSIS - Obstructive Sleep Apnea (327.23 [G47.33 ICD-10])  RECOMMENDATIONS - Trial of CPAP therapy on 7 cm H2O with a Medium size Resmed Full Face Mask AirFit F20 mask and heated humidification. - Avoid alcohol, sedatives and other CNS depressants that may worsen sleep apnea and disrupt normal sleep architecture. - Sleep hygiene should be reviewed to  assess factors that may improve sleep quality. - Weight management and regular exercise should be initiated or continued. - Return to [facility] Center for re-evaluation after 10 weeks of therapy   [place], American Board of Sleep Medicine  ELECTRONICALLY SIGNED ON:  05/09/2015, 1:44 PM [place] PH: [phone]   FX: [phone] [place]

## 2015-05-09 NOTE — Telephone Encounter (Signed)
Please let patient know that they have significant sleep apnea and had successful CPAP titration and will be set up with CPAP unit.  Please let DME know that order is in EPIC.  Please set patient up for OV in 10 weeks 

## 2015-05-13 NOTE — Telephone Encounter (Signed)
Left message to call back for sleep study results.  

## 2015-05-20 NOTE — Telephone Encounter (Signed)
Patient has been informed of information.  Stated verbal understanding.    [facility]  Once patient starts CPAP Therapy, I will schedule 10 week follow-up visit.  Patient is aware that I will be calling him back to schedule this.

## 2015-05-21 ENCOUNTER — Ambulatory Visit: Payer: BC Managed Care – PPO | Admitting: Cardiovascular Disease

## 2015-05-26 ENCOUNTER — Ambulatory Visit (INDEPENDENT_AMBULATORY_CARE_PROVIDER_SITE_OTHER): Payer: BC Managed Care – PPO | Admitting: Cardiovascular Disease

## 2015-05-26 ENCOUNTER — Encounter: Payer: Self-pay | Admitting: Cardiovascular Disease

## 2015-05-26 VITALS — BP 110/80 | HR 63 | Ht 68.0 in | Wt 175.0 lb

## 2015-05-26 DIAGNOSIS — G4733 Obstructive sleep apnea (adult) (pediatric): Secondary | ICD-10-CM

## 2015-05-26 DIAGNOSIS — E785 Hyperlipidemia, unspecified: Secondary | ICD-10-CM | POA: Diagnosis not present

## 2015-05-26 DIAGNOSIS — I119 Hypertensive heart disease without heart failure: Secondary | ICD-10-CM | POA: Diagnosis not present

## 2015-05-26 NOTE — Progress Notes (Signed)
Patient ID: Jeffery Strickland, male   DOB: 26-Aug-1955, 60 y.o.   MRN: [id number]    Cardiology Office Note    Date:  05/28/2015   ID:  Jeffery Strickland, DOB 26-Aug-1955, MRN [id number]  PCP:  Geraldo PitterBLAND,VEITA J, MD  Cardiologist:   Thurmon FairMihai Rosbel Buckner, MD   Chief Complaint  Patient presents with  . 3 months    Follow up after echo and Holter monitor    History of Present Illness:  Jeffery Strickland is a 60 y.o. male with HTN-related LVH and hyperlipidemia, recently diagnosed OSA presents for evaluation after echo and rhythm monitor ordered following an episodes of diaphoreses and lightheadedness. His cardiac tests were normal and it appears his symptoms may be related to low testosterone levels.  He feels well. He goes to the gym 3-4 times a week. He is reluctantly going for a CPAP mask fitting. His wife reports symptoms highly consistent with OSA and his sleep study confirmed the diagnosis.  Past Medical History  Diagnosis Date  . Hypertensive heart disease   . Hyperlipemia   . History of nuclear stress test     Myoview 7/16:  EF 64%, normal perfusion, low risk  . History of echocardiogram     Echo 3/11:  Normal LV function, mild basal septal hypertrophy, mild diastolic dysfunction, no significant valvular abnormalities // Echo 2/17: EF 60-65%, normal wall motion, grade 1 diastolic dysfunction, MAC  . CKD (chronic kidney disease)   . OSA (obstructive sleep apnea) 05/09/2015    Past Surgical History  Procedure Laterality Date  . [place] echocardiography  03/22/2009    mild basal septal LV hypertropher,mild diastolic dysfx,consider early hypertrophic cardiomyopathy  . Nm myocar perf wall motion  01/11/2010    normal  . Split night study  05/02/2015  . Nocturnal polysomnography with seizure montage  05/02/2015    Current Medications: Outpatient Prescriptions Prior to Visit  Medication Sig Dispense Refill  . aspirin 81 MG tablet Take 81 mg by mouth daily.    . nebivolol (BYSTOLIC) 2.5 MG tablet Take 1  tablet (2.5 mg total) by mouth daily. 30 tablet 6  . rosuvastatin (CRESTOR) 10 MG tablet Take 10 mg by mouth daily.    Marland Kitchen. spironolactone (ALDACTONE) 25 MG tablet Take 25 mg by mouth daily.   7  . TRIBENZOR 40-10-12.5 MG TABS Take 1 tablet by mouth daily. Take 1 tab daily     No facility-administered medications prior to visit.     Allergies:   Review of patient's allergies indicates no known allergies.   Social History   Social History  . Marital Status: Married    Spouse Name: N/A  . Number of Children: N/A  . 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 Smoking status: Never Smoker   . Smokeless tobacco: Never Used  . Alcohol Use: No  . Drug Use: No  . Sexual Activity: Not Asked   Other Topics Concern  . None   Social History Narrative     Family History:  The patient's family history includes Cancer in his sister; Diabetes in his sister; Heart attack in his father.   ROS:   Please see the history of present illness.    ROS All other systems reviewed and are negative.   PHYSICAL EXAM:   VS:  BP 110/80 mmHg  Pulse 63  Ht 5\' 8"  (1.727 m)  Wt 79.379  kg (175 lb)  BMI 26.61 kg/m2   GEN: Well nourished, well developed, in no acute distress HEENT: normal Neck: no JVD, carotid bruits, or masses Cardiac: RRR; no murmurs, rubs, or gallops,no edema  Respiratory:  clear to auscultation bilaterally, normal work of breathing GI: soft, nontender, nondistended, + BS MS: no deformity or atrophy Skin: warm and dry, no rash Neuro:  Alert and Oriented x 3, Strength and sensation are intact Psych: euthymic mood, full affect  Wt Readings from Last 3 Encounters:  05/26/15 79.379 kg (175 lb)  05/02/15 79.379 kg (175 lb)  02/19/15 80.341 kg (177 lb 1.9 oz)      Studies/Labs Reviewed:   EKG:  EKG is not ordered today.    Labs checked by Dr. Parke Simmers  ASSESSMENT:    1.  Hypertensive heart disease without CHF   2. OSA (obstructive sleep apnea)   3. Hyperlipidemia      PLAN:  In order of problems listed above:  1. HTN with secondary LVH. Has echo evidence of diastolic dysfunction, without clinical CHF. BP is well controlled. No gynecomastia on aldactone. Labs checked by Dr. Parke Simmers were reportedly OK. 2. OSA: discussed pathophysiology of OSA in detail, explained the rationale for CPAP treatment as first line therapy, reviewed alternatives such as a dental device. He is willing to try CPAP. He has two brothers, both obese, who use CPAP. 3. HLP: will request labs from Dr. Parke Simmers. On statin. Normal nuclear study in the past.    Medication Adjustments/Labs and Tests Ordered: Current medicines are reviewed at length with the patient today.  Concerns regarding medicines are outlined above.  Medication changes, Labs and Tests ordered today are listed in the Patient Instructions below. Patient Instructions  Your physician recommends that you continue on your current medications as directed. Please refer to the Current Medication list given to you today.  Dr Royann Shivers recommends that you schedule a follow-up appointment in 1 year. You will receive a reminder letter in the mail two months in advance. If you don't receive a letter, please call our office to schedule the follow-up appointment.  If you need a refill on your cardiac medications before your next appointment, please call your pharmacy.    Signed, [name], MD  05/28/2015 8:58 AM    [facility] [address] [place], [place], [place]  [address] Phone: [phone]; Fax: [phone]

## 2015-05-26 NOTE — Patient Instructions (Signed)
Your physician recommends that you continue on your current medications as directed. Please refer to the Current Medication list given to you today.  Dr Croitoru recommends that you schedule a follow-up appointment in 1 year. You will receive a reminder letter in the mail two months in advance. If you don't receive a letter, please call our office to schedule the follow-up appointment.  If you need a refill on your cardiac medications before your next appointment, please call your pharmacy. 

## 2015-08-26 ENCOUNTER — Encounter: Payer: Self-pay | Admitting: Cardiology

## 2015-08-29 ENCOUNTER — Encounter: Payer: Self-pay | Admitting: Cardiology

## 2015-09-05 NOTE — Progress Notes (Signed)
Cardiology Office Note    Date:  09/09/2015   ID:  Jeffery Strickland, DOB 27-Mar-1955, MRN [id number]  PCP:  Geraldo PitterBLAND,VEITA J, MD  Cardiologist:  Armanda Magicraci Dalary Hollar, MD   Chief Complaint  Patient presents with  . Sleep Apnea  . Appointment    has recently had some chest discomfort, same as before    History of Present Illness:  Jeffery Strickland is a 60 y.o. male with a history of hyperlipidemia, hypertensive heart disease and OSA.  He is referred for treatment of OSA. He was referred for PSG by Tereso NewcomerScott Weaver for evaluation of OSA. The patient had requested a sleep study due to his wife complaining of him snoring and had witnessed apnea.  He says that he could not really tell that he had daytime sleepiness until he got on CPAP and now realizes that he was tired before the CPAP.   Sleep study showed severe OSA with an AHI of 48/hr with severe oxygen desaturations as low as 64%.  He underwent CPAP titration to 7cm H2O and is now on CPAP therapy.  He is doing well with his device.  He tolerates the nasal mask and does not wear a chin strap.  Since going on CPAP, he has had improvement in daytime sleepiness and feels more rested in the am.  He denies any problems with mouth dryness or nasal congestion.      Past Medical History:  Diagnosis Date  . CKD (chronic kidney disease)   . History of echocardiogram    Echo 3/11:  Normal LV function, mild basal septal hypertrophy, mild diastolic dysfunction, no significant valvular abnormalities // Echo 2/17: EF 60-65%, normal wall motion, grade 1 diastolic dysfunction, MAC  . History of nuclear stress test    Myoview 7/16:  EF 64%, normal perfusion, low risk  . Hyperlipemia   . Hypertensive heart disease   . OSA (obstructive sleep apnea) 05/09/2015    Past Surgical History:  Procedure Laterality Date  . NM MYOCAR PERF WALL MOTION  01/11/2010   normal  . NOCTURNAL POLYSOMNOGRAPHY WITH SEIZURE MONTAGE  05/02/2015  . SPLIT NIGHT STUDY  05/02/2015  . US  ECHOCARDIOGRAPHY  03/22/2009   mild basal septal LV hypertropher,mild diastolic dysfx,consider early hypertrophic cardiomyopathy    Current Medications: Outpatient Medications Prior to Visit  Medication Sig Dispense Refill  . aspirin 81 MG tablet Take 81 mg by mouth daily.    . nebivolol (BYSTOLIC) 2.5 MG tablet Take 1 tablet (2.5 mg total) by mouth daily. 30 tablet 6  . rosuvastatin (CRESTOR) 10 MG tablet Take 10 mg by mouth daily.    Marland Kitchen. spironolactone (ALDACTONE) 25 MG tablet Take 25 mg by mouth daily.   7  . TRIBENZOR 40-10-12.5 MG TABS Take 1 tablet by mouth daily. Take 1 tab daily     No facility-administered medications prior to visit.      Allergies:   Review of patient's allergies indicates no known allergies.   Social History   Social History  . Marital status: Married    Spouse name: N/A  . Number of children: N/A  . Years of education: N/A   Occupational History  . Guidance counselor [employer]   Social History Main Topics  . Smoking status: Never Smoker  . Smokeless tobacco: Never Used  . Alcohol use No  . Drug use: No  . Sexual activity: Not Asked   Other Topics Concern  . None   Social History  Narrative  . None     Family History:  The patient's family history includes Cancer in his sister; Diabetes in his sister; Heart attack in his father.   ROS:   Please see the history of present illness.    ROS All other systems reviewed and are negative.   PHYSICAL EXAM:   VS:  BP 122/78   Pulse 76   Ht 5\' 8"  (1.727 m)   Wt 168 lb (76.2 kg)   BMI 25.54 kg/m    GEN: Well nourished, well developed, in no acute distress  HEENT: normal  Neck: no JVD, carotid bruits, or masses Cardiac: RRR; no murmurs, rubs, or gallops,no edema.  Intact distal pulses bilaterally.  Respiratory:  clear to auscultation bilaterally, normal work of breathing GI: soft, nontender, nondistended, + BS MS: no deformity or atrophy  Skin: warm and dry, no  rash Neuro:  Alert and Oriented x 3, Strength and sensation are intact Psych: euthymic mood, full affect  Wt Readings from Last 3 Encounters:  09/09/15 168 lb (76.2 kg)  05/26/15 175 lb (79.4 kg)  05/02/15 175 lb (79.4 kg)      Studies/Labs Reviewed:   EKG:  EKG is not ordered today.    Recent Labs: No results found for requested labs within last 8760 hours.   Lipid Panel No results found for: CHOL, TRIG, HDL, CHOLHDL, VLDL, LDLCALC, LDLDIRECT  Additional studies/ records that were reviewed today include:  CPAP dowload    ASSESSMENT:    1. OSA (obstructive sleep apnea)   2. Essential hypertension      PLAN:  In order of problems listed above:  OSA - the patient is tolerating PAP therapy well without any problems. The PAP download was reviewed today and showed an AHI of 12.9/hr on 7 cm H2O with 100% compliance in using more than 4 hours nightly.  The patient has been using and benefiting from CPAP use and will continue to benefit from therapy.   I am going to add a chin strap since his AHI is still too high and get a 2 week autotitration from 4 to 18cm H2O.   2.  HTN - BP controlled on current meds.  Continue BB/diuretic/ARB and amlodipine    Medication Adjustments/Labs and Tests Ordered: Current medicines are reviewed at length with the patient today.  Concerns regarding medicines are outlined above.  Medication changes, Labs and Tests ordered today are listed in the Patient Instructions below.  Patient Instructions  Medication Instructions:  Your physician recommends that you continue on your current medications as directed. Please refer to the Current Medication list given to you today.   Labwork: None  Testing/Procedures: Dr. Mayford Knifeurner has ordered for you a chin strap for your CPAP machine. Once you receive that, you will have a 2 week auto-titration.  Follow-Up: Your physician wants you to follow-up in: 1 year with Dr. Mayford Knifeurner. You will receive a reminder letter  in the mail two months in advance. If you don't receive a letter, please call our office to schedule the follow-up appointment.   Any Other Special Instructions Will Be Listed Below (If Applicable). Toma CopierBethany, CMA is our office CPAP assistant. If you have any questions or concerns CPAP related, you may call her directly at [phone].    If you need a refill on your cardiac medications before your next appointment, please call your pharmacy.      Signed, Armanda Magicraci Maddisen Vought, MD  09/09/2015 9:03 AM    [facility]  [address], [place], [place]  [address] Phone: [phone]; Fax: [phone]

## 2015-09-09 ENCOUNTER — Ambulatory Visit (INDEPENDENT_AMBULATORY_CARE_PROVIDER_SITE_OTHER): Payer: BC Managed Care – PPO | Admitting: Cardiology

## 2015-09-09 ENCOUNTER — Encounter: Payer: Self-pay | Admitting: Cardiology

## 2015-09-09 VITALS — BP 122/78 | HR 76 | Ht 68.0 in | Wt 168.0 lb

## 2015-09-09 DIAGNOSIS — G4733 Obstructive sleep apnea (adult) (pediatric): Secondary | ICD-10-CM | POA: Diagnosis not present

## 2015-09-09 DIAGNOSIS — I10 Essential (primary) hypertension: Secondary | ICD-10-CM

## 2015-09-09 NOTE — Patient Instructions (Signed)
Medication Instructions:  Your physician recommends that you continue on your current medications as directed. Please refer to the Current Medication list given to you today.   Labwork: None  Testing/Procedures: Dr. Mayford Knifeurner has ordered for you a chin strap for your CPAP machine. Once you receive that, you will have a 2 week auto-titration.  Follow-Up: Your physician wants you to follow-up in: 1 year with Dr. Mayford Knifeurner. You will receive a reminder letter in the mail two months in advance. If you don't receive a letter, please call our office to schedule the follow-up appointment.   Any Other Special Instructions Will Be Listed Below (If Applicable). Jeffery CopierBethany, CMA is our office CPAP assistant. If you have any questions or concerns CPAP related, you may call her directly at [phone].    If you need a refill on your cardiac medications before your next appointment, please call your pharmacy.

## 2015-09-11 ENCOUNTER — Other Ambulatory Visit: Payer: Self-pay | Admitting: Cardiovascular Disease

## 2015-09-13 NOTE — Telephone Encounter (Signed)
Rx(s) sent to pharmacy electronically.  

## 2015-09-16 ENCOUNTER — Encounter: Payer: Self-pay | Admitting: Cardiology

## 2015-09-30 ENCOUNTER — Encounter: Payer: Self-pay | Admitting: Cardiology

## 2015-11-24 ENCOUNTER — Other Ambulatory Visit: Payer: Self-pay | Admitting: Family Medicine

## 2015-11-24 ENCOUNTER — Ambulatory Visit
Admission: RE | Admit: 2015-11-24 | Discharge: 2015-11-24 | Disposition: A | Discharge status: Home / Self Care | Payer: BC Managed Care – PPO | Source: Ambulatory Visit | Attending: Family Medicine | Admitting: Family Medicine

## 2015-11-24 DIAGNOSIS — M79642 Pain in left hand: Secondary | ICD-10-CM

## 2015-11-24 IMAGING — CR DG HAND COMPLETE 3+V*L*
3 series · 3 of 3 positions shown · non-contrast
Comparison: None.

CLINICAL DATA: Increased episodes of left thumb pain for months

EXAM:
LEFT HAND - COMPLETE 3+ VIEW

[x hand pa left]
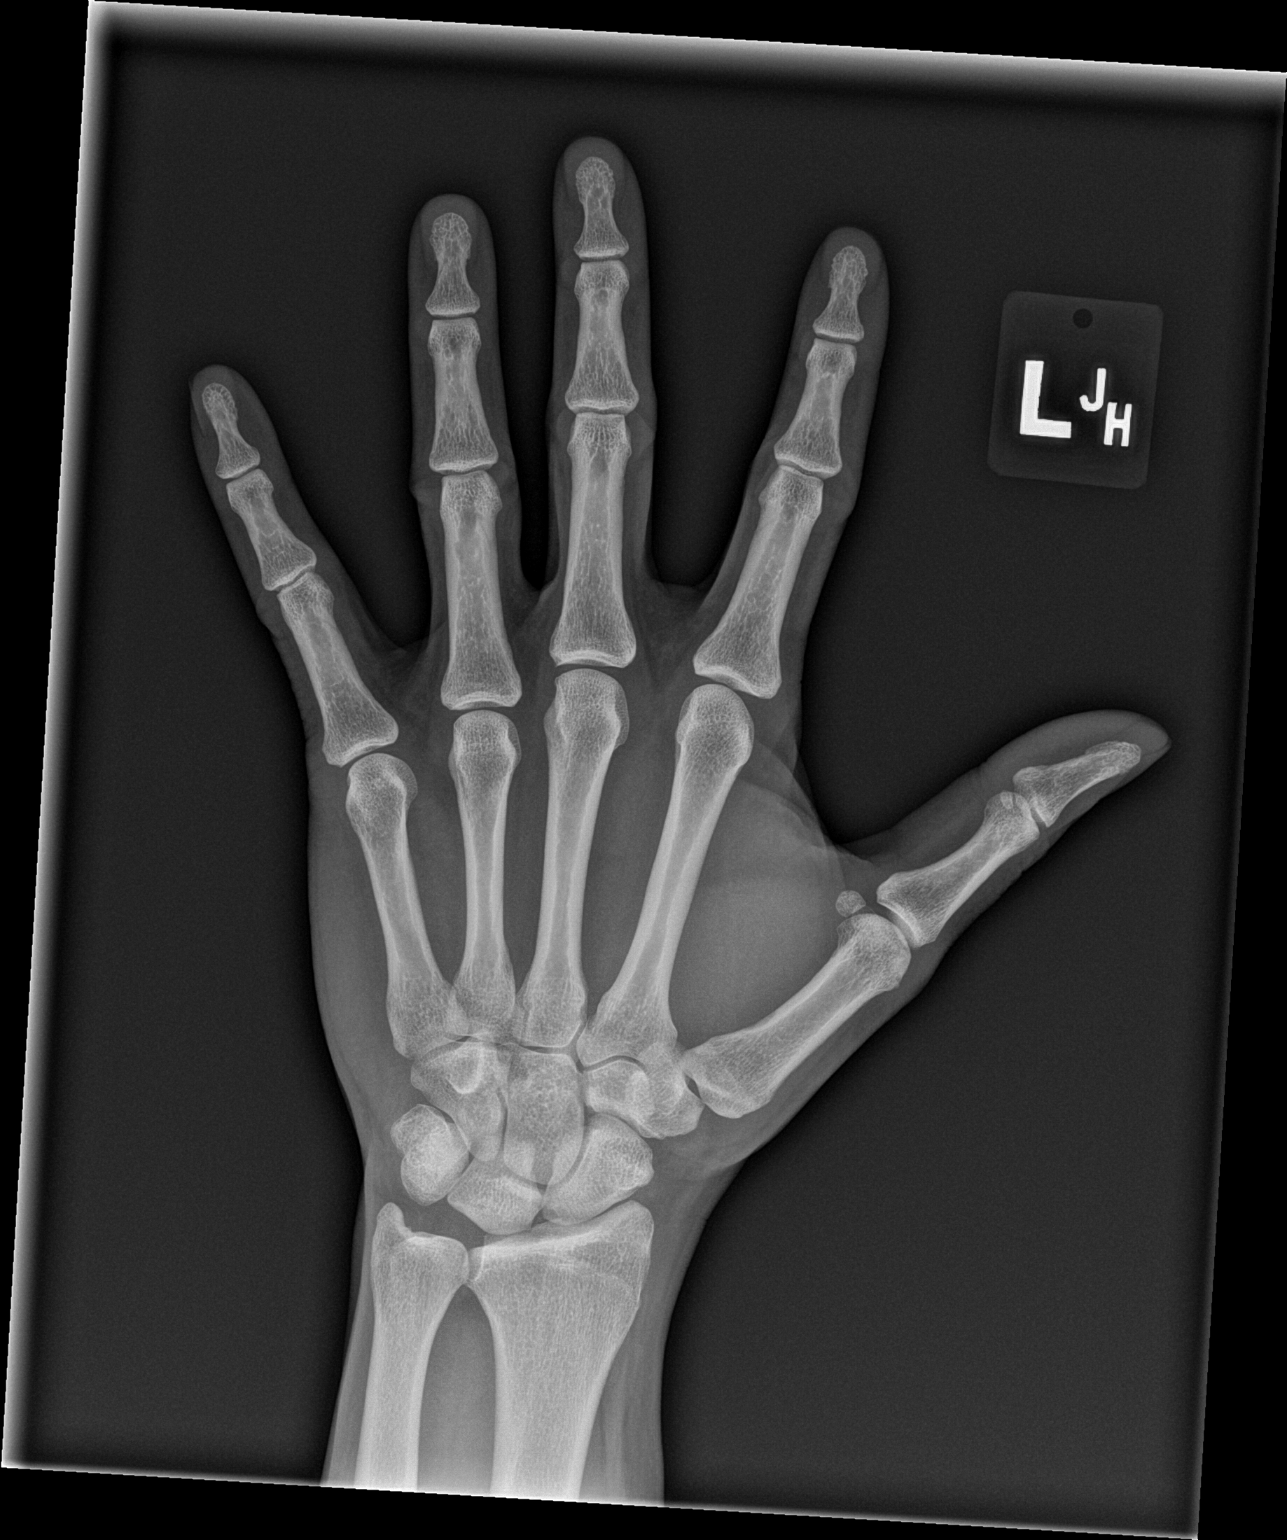

[x hand obl left]
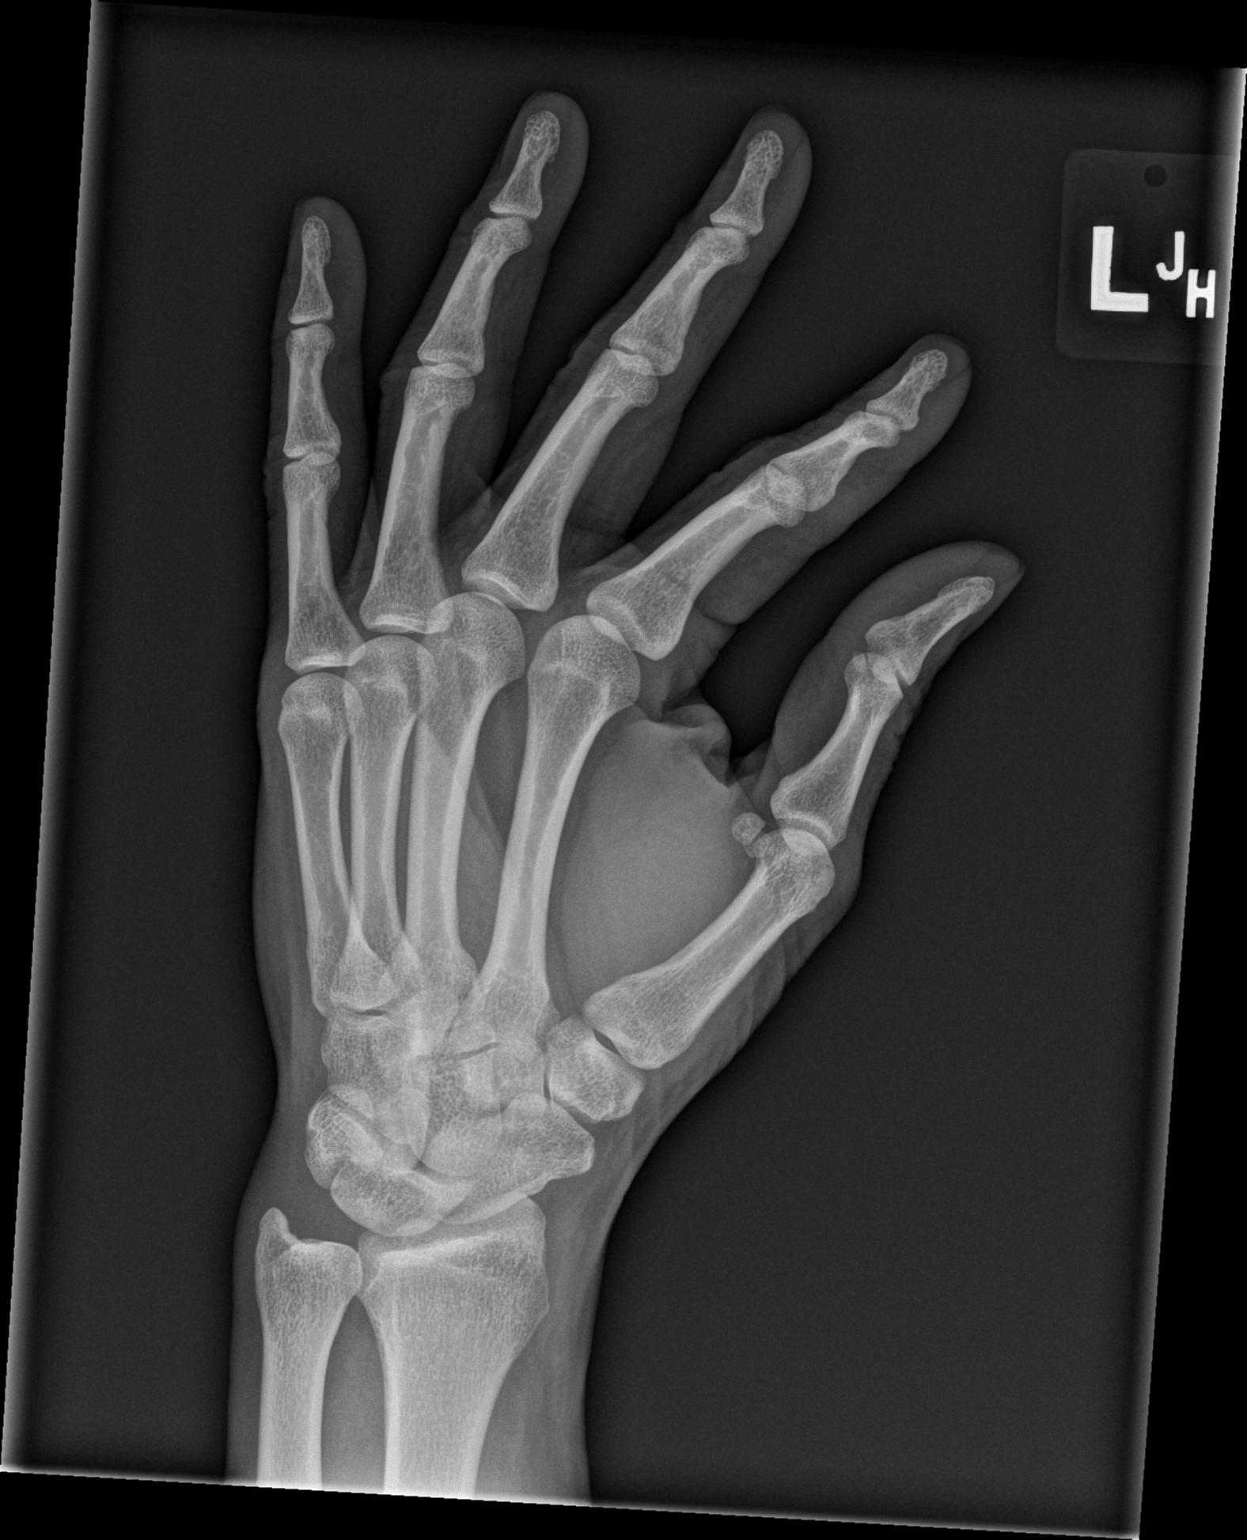

[x hand lat left]
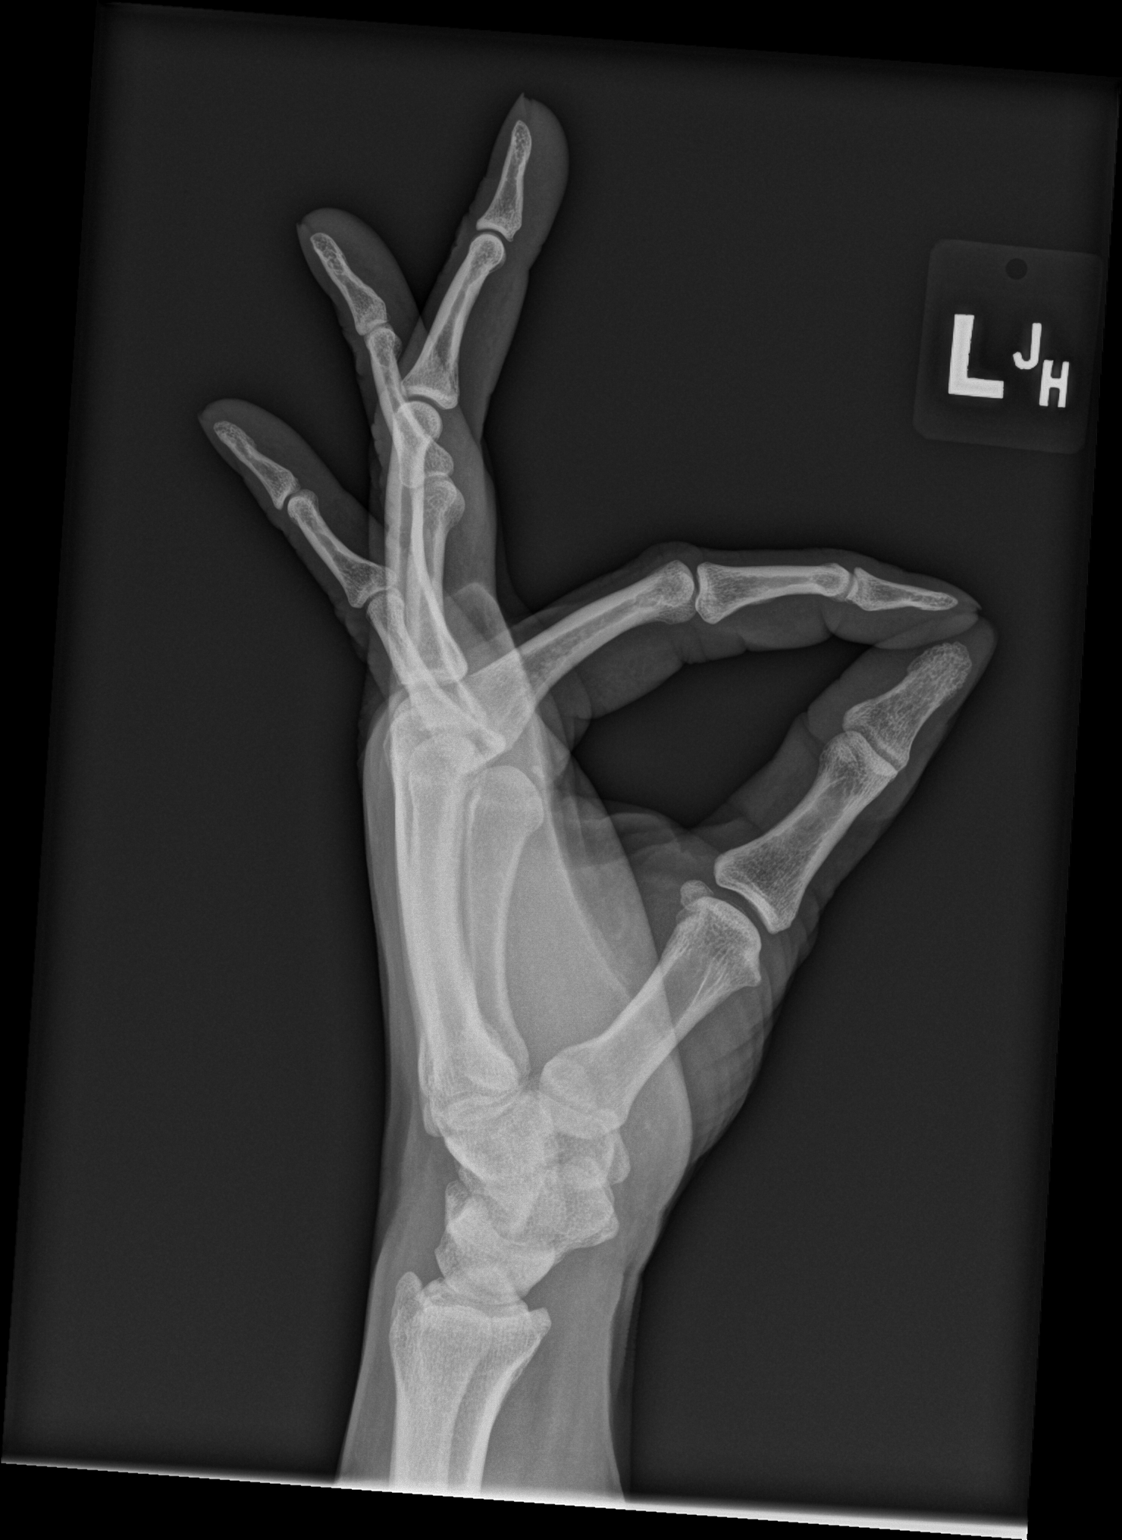

[3 of 3 positions shown; findings below may reference images not displayed]

FINDINGS: There is no evidence of fracture or dislocation. There is no
evidence of arthropathy or other focal bone abnormality. Soft
tissues are unremarkable.
IMPRESSION: Negative.

## 2016-05-10 ENCOUNTER — Encounter: Payer: Self-pay | Admitting: Cardiovascular Disease

## 2017-11-08 ENCOUNTER — Ambulatory Visit
Admission: RE | Admit: 2017-11-08 | Discharge: 2017-11-08 | Disposition: A | Discharge status: Home / Self Care | Payer: BC Managed Care – PPO | Source: Ambulatory Visit | Attending: Family Medicine | Admitting: Family Medicine

## 2017-11-08 ENCOUNTER — Other Ambulatory Visit: Payer: Self-pay | Admitting: Family Medicine

## 2017-11-08 DIAGNOSIS — R52 Pain, unspecified: Secondary | ICD-10-CM

## 2017-11-08 IMAGING — CR DG ANKLE COMPLETE 3+V*L*
3 series · 3 of 3 positions shown · non-contrast
Comparison: None.

CLINICAL DATA: Medial malleolar pain.

EXAM:
LEFT ANKLE COMPLETE - 3+ VIEW

[x ankle ap left]
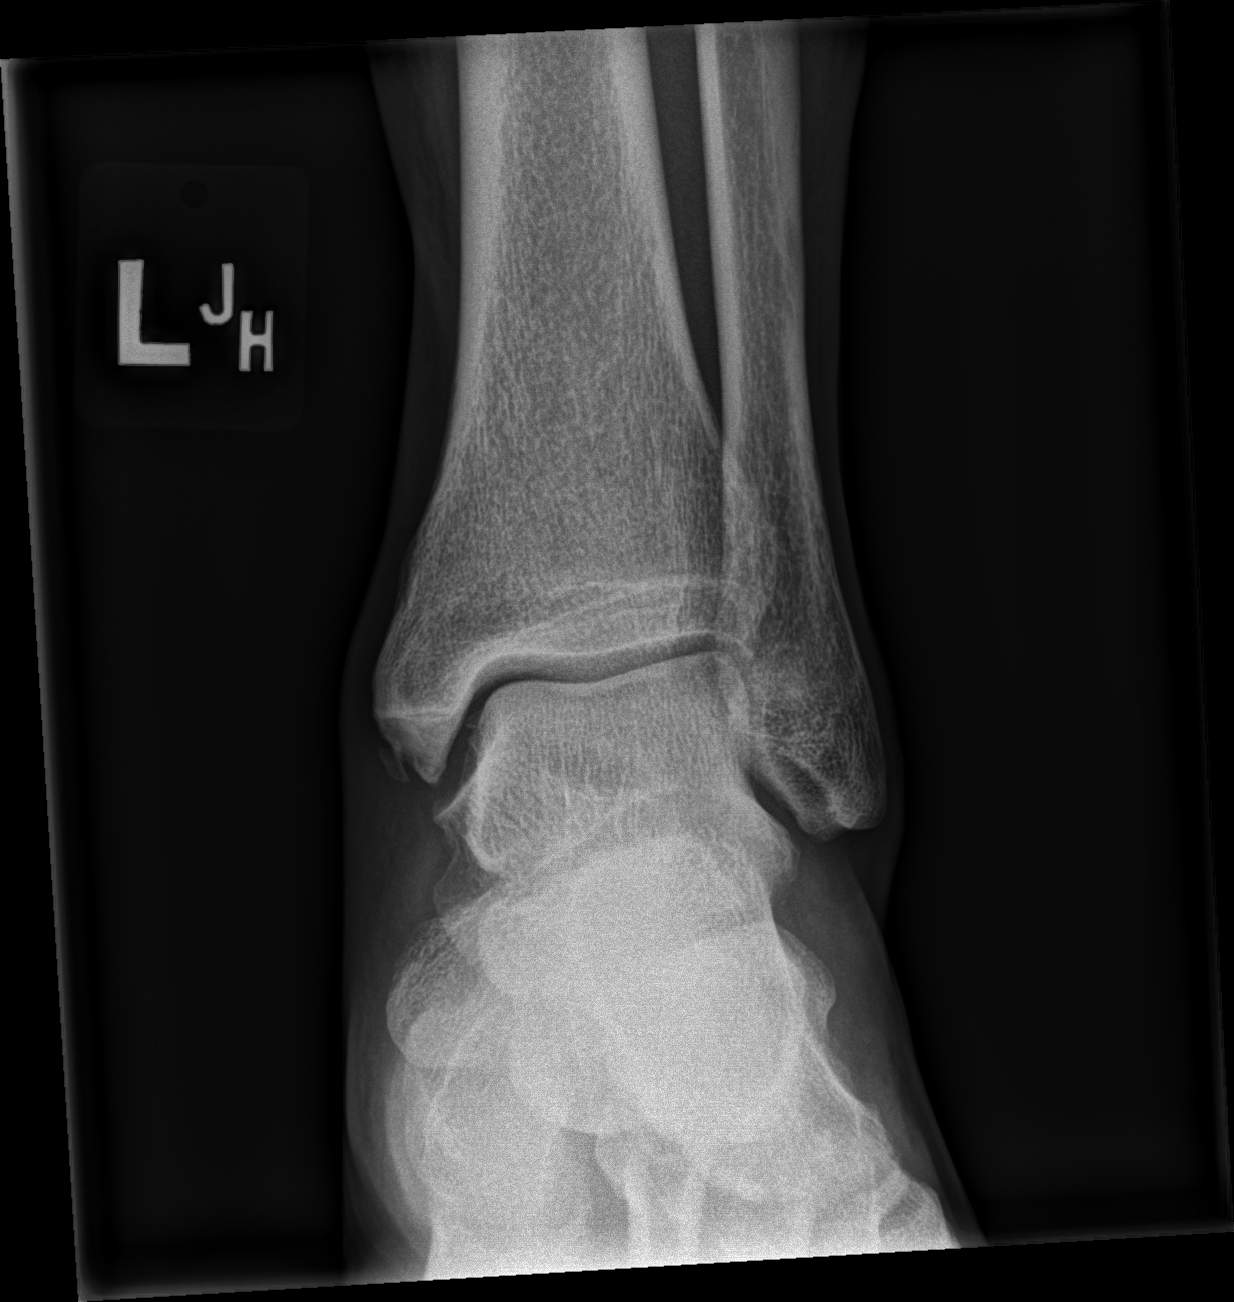

[x ankle obl left]
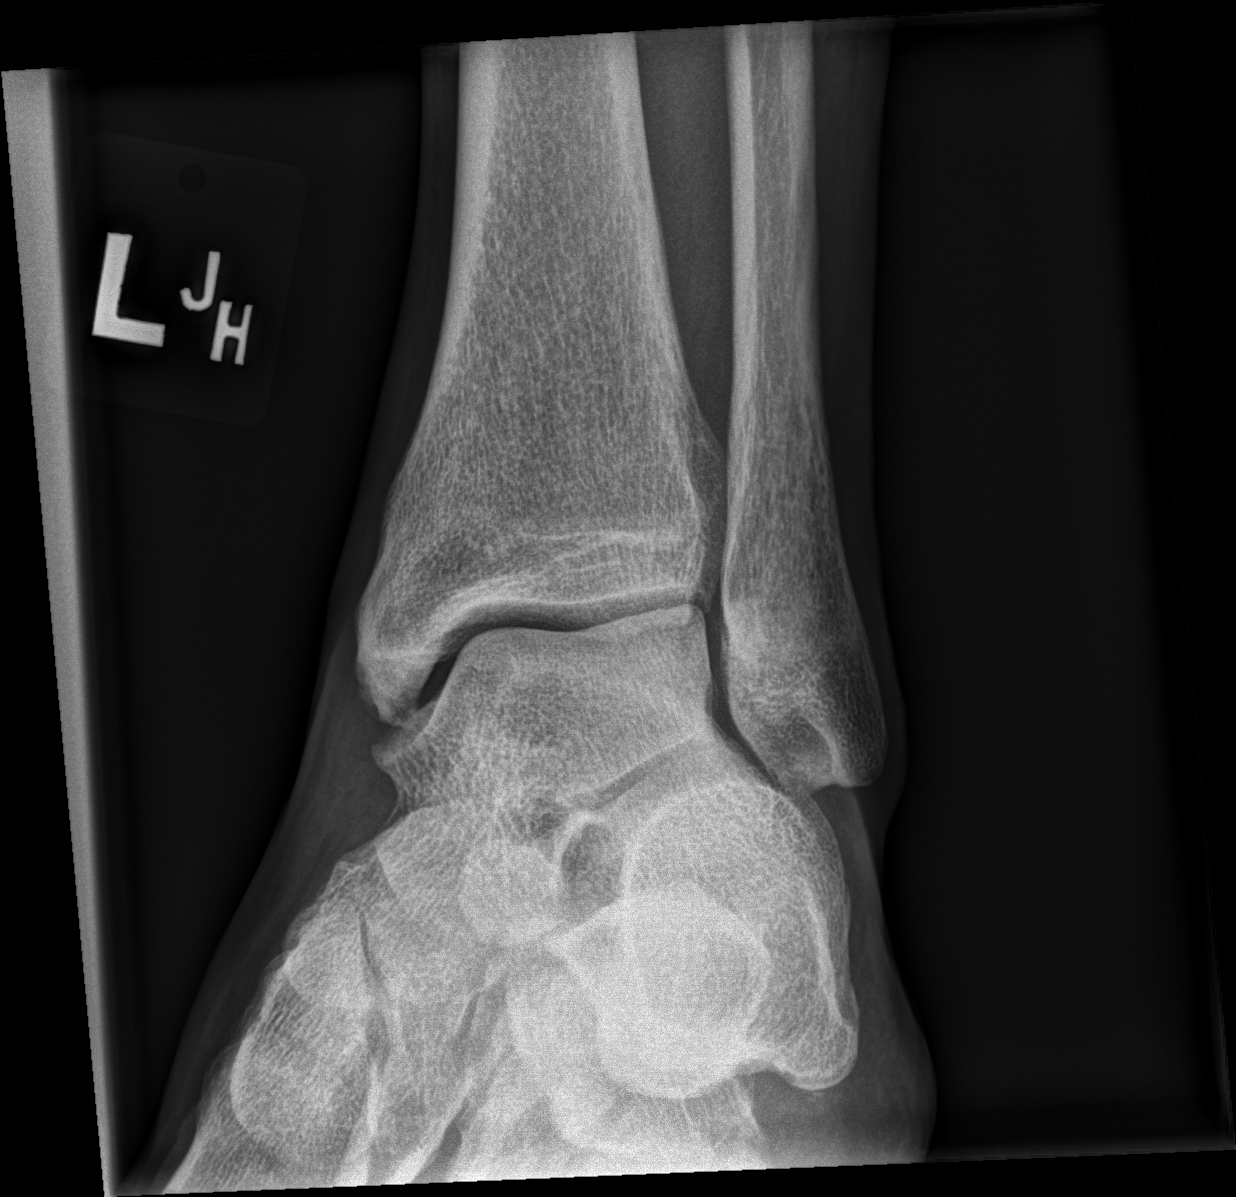

[x ankle lat left]
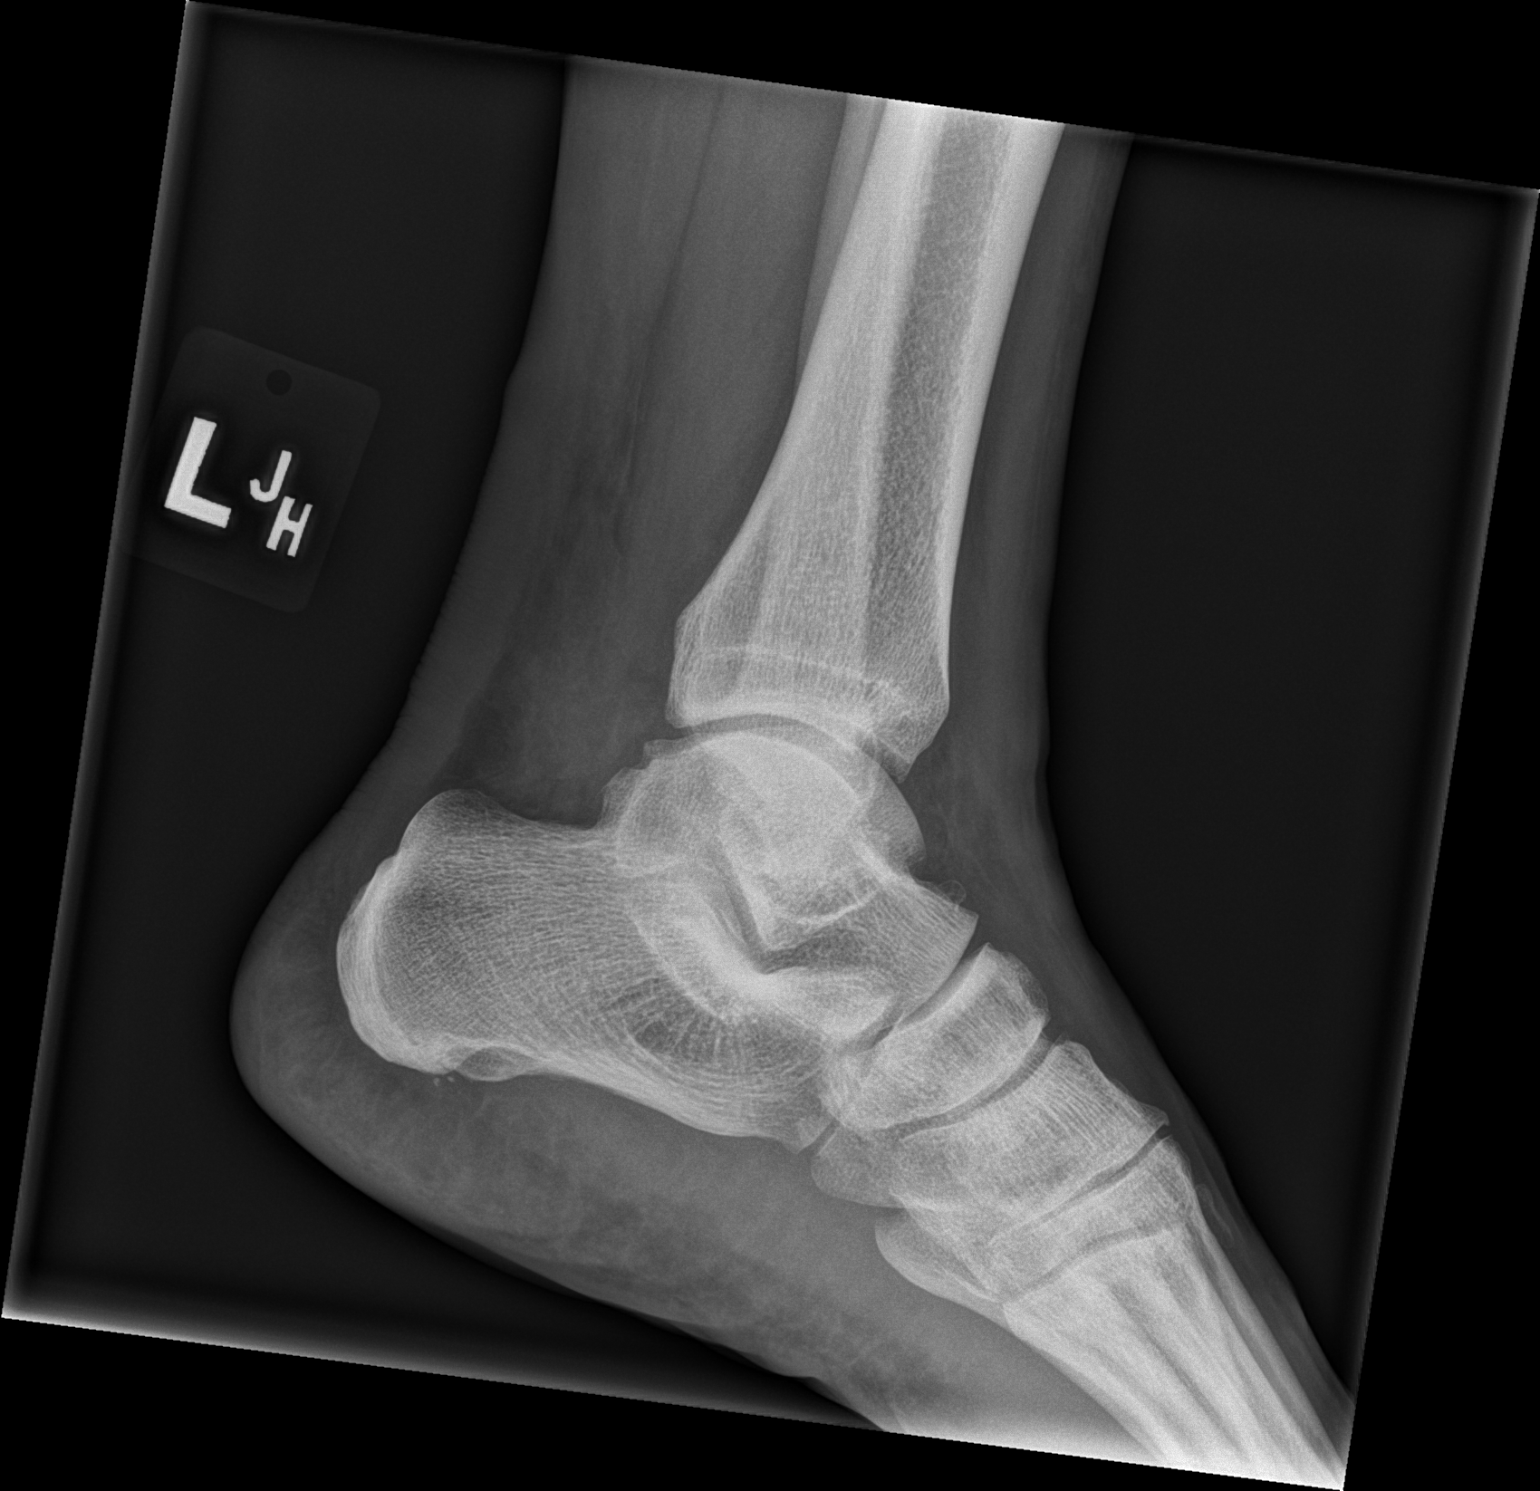

[3 of 3 positions shown; findings below may reference images not displayed]

FINDINGS: There is no evidence of acute fracture. Mild widening of the medial
ankle mortise. Small enthesophytes off of the medial malleolus. Soft
tissue thickening posterior to the ankle mortise. Mild medial soft
tissue swelling.
IMPRESSION: Mild widening of the left medial ankle mortise.

Soft tissue thickening posterior to the ankle mortise.

Mild medial soft tissue swelling.

This constellation of findings may be seen with ligamentous
injury/internal derangement of the ankle.

## 2017-12-25 ENCOUNTER — Encounter: Payer: Self-pay | Admitting: Sports Medicine

## 2017-12-25 ENCOUNTER — Other Ambulatory Visit: Payer: Self-pay

## 2017-12-25 ENCOUNTER — Ambulatory Visit: Payer: Self-pay

## 2017-12-25 ENCOUNTER — Ambulatory Visit: Payer: BC Managed Care – PPO | Admitting: Sports Medicine

## 2017-12-25 VITALS — BP 111/66 | HR 68

## 2017-12-25 DIAGNOSIS — S93422A Sprain of deltoid ligament of left ankle, initial encounter: Secondary | ICD-10-CM

## 2017-12-25 DIAGNOSIS — T148XXA Other injury of unspecified body region, initial encounter: Secondary | ICD-10-CM

## 2017-12-25 DIAGNOSIS — M25572 Pain in left ankle and joints of left foot: Secondary | ICD-10-CM

## 2017-12-25 DIAGNOSIS — M7750 Other enthesopathy of unspecified foot: Secondary | ICD-10-CM

## 2017-12-25 MED ORDER — METHYLPREDNISOLONE 4 MG PO TBPK: ORAL_TABLET | ORAL | 0 refills | Status: DC

## 2017-12-25 NOTE — Progress Notes (Signed)
Subjective:  Jeffery Strickland is a 62 y.o. male patient who presents to office for evaluation of left ankle pain. Patient complains of continued pain in the ankle since October states that his doctor treated him with pain medication and he has been using a ankle brace that got better for a little bit but flared up since Thursday and states by Saturday he could not put pressure on the foot and ankle states that the pain is worse at the inside of his ankle even with touch states in addition to wearing the brace he has also tried rubbing alcohol.  Patient reports that his primary care doctor ordered x-rays from October and has also checked him for gout which was negative. Patient denies any other pedal complaints. Denies injury/trip/fall/sprain/any causative factors but does admit a history of 40 years ago of severely spraining his left ankle and is wondering if there could be issues with an old injury that was never treated that could be causing his pain.  Review of Systems  Musculoskeletal: Positive for joint pain.  All other systems reviewed and are negative.   Patient Active Problem List   Diagnosis Date Noted  . OSA (obstructive sleep apnea) 05/09/2015  . Chest discomfort 07/03/2014  . HTN (hypertension) 08/11/2012  . Hyperlipidemia 08/11/2012  . Hypertensive heart disease without CHF 08/11/2012  . Other ejaculatory dysfunction 08/11/2012    Current Outpatient Medications on File Prior to Visit  Medication Sig Dispense Refill  . aspirin 81 MG tablet Take 81 mg by mouth daily.    Marland Kitchen BYSTOLIC 2.5 MG tablet TAKE 1 TABLET BY MOUTH DAILY. 30 tablet 6  . rosuvastatin (CRESTOR) 10 MG tablet Take 10 mg by mouth daily.    Marland Kitchen spironolactone (ALDACTONE) 25 MG tablet Take 25 mg by mouth daily.   7  . TRIBENZOR 40-10-12.5 MG TABS Take 1 tablet by mouth daily. Take 1 tab daily     No current facility-administered medications on file prior to visit.     No Known Allergies  Objective:  General: Alert  and oriented x3 in no acute distress  Dermatology: No open lesions bilateral lower extremities, no webspace macerations, no ecchymosis bilateral, all nails x 10 are well manicured.  Vascular: Dorsalis Pedis and Posterior Tibial pedal pulses palpable, Capillary Fill Time 3 seconds,(+) pedal hair growth bilateral, no edema bilateral lower extremities, Temperature gradient within normal limits.  Neurology: Michaell Cowing sensation intact via light touch bilateral.  Musculoskeletal: Mild tenderness with palpation at medial ankle especially over the deltoid ligament and with direct palpation to the medial malleolus greater than the posterior tibial tendon.  Pain with eversion of left foot and ankle.  Strength within normal limits in all groups bilateral however mild guarding due to pain.   Gait: Antalgic gait  X-ray left ankle 11/08/2017 IMPRESSION: Mild widening of the left medial ankle mortise.  Soft tissue thickening posterior to the ankle mortise.  Mild medial soft tissue swelling.  This constellation of findings may be seen with ligamentous injury/internal derangement of the ankle.  Assessment and Plan: Problem List Items Addressed This Visit    None    Visit Diagnoses    Tendinitis of ankle or foot    -  Primary   Tendon tear       Sprain of deltoid ligament of left ankle, initial encounter       Left ankle pain, unspecified chronicity           -Complete examination performed -Xrays reviewed -Discussed treatement options  for possible tendon tear versus severe sprain with instability -Patient at this time declines a cam walker and prefers to continue with his ankle brace -Rx Medrol Dosepak for pain and inflammation -Order MRI for further evaluation of with ligamentous tear or injury -Patient to return to office after MRI or sooner if condition worsens.  Asencion Islamitorya Camilla Skeen, DPM

## 2017-12-27 ENCOUNTER — Telehealth: Payer: Self-pay | Admitting: *Deleted

## 2017-12-27 DIAGNOSIS — T148XXA Other injury of unspecified body region, initial encounter: Secondary | ICD-10-CM

## 2017-12-27 DIAGNOSIS — M25572 Pain in left ankle and joints of left foot: Secondary | ICD-10-CM

## 2017-12-27 DIAGNOSIS — M7750 Other enthesopathy of unspecified foot: Secondary | ICD-10-CM

## 2017-12-27 DIAGNOSIS — S93422A Sprain of deltoid ligament of left ankle, initial encounter: Secondary | ICD-10-CM

## 2017-12-27 NOTE — Telephone Encounter (Signed)
-----   Message from [place] Stover, [place] sent at 12/25/2017  3:30 PM EST ----- Regarding: MRI L R/o tear pain at deltoid and medial ankle ligaments since oct

## 2017-12-27 NOTE — Telephone Encounter (Signed)
Orders to J. Quintana, RN for pre-cert, faxed to [facility]. 

## 2018-01-07 ENCOUNTER — Ambulatory Visit
Admission: RE | Admit: 2018-01-07 | Discharge: 2018-01-07 | Disposition: A | Discharge status: Home / Self Care | Payer: BC Managed Care – PPO | Source: Ambulatory Visit | Attending: Sports Medicine | Admitting: Sports Medicine

## 2018-01-07 ENCOUNTER — Telehealth: Payer: Self-pay | Admitting: *Deleted

## 2018-01-07 NOTE — Telephone Encounter (Signed)
Pt called for results.

## 2018-01-07 NOTE — Telephone Encounter (Signed)
-----   Message from [place] Stover, [place] sent at 01/07/2018  8:59 AM EST ----- Regarding: MRI Results MRI negative for tear but does show that the Deltoid tendon at the inside (medial) part of his ankle is strained. I recommended in office for him to have a CAM boot but he declined. If it is still hurting I recommend him to come by office to pick up CAM boot to wear for 2 weeks and then to follow up in office after that for a follow up evaluation Thanks Dr. Marylene LandStover

## 2018-01-07 NOTE — Telephone Encounter (Signed)
I called pt and informed of Dr. Wynema BirchStover's review of MRI results and orders. Pt states he would like to pick up the Cam Boot today. I told pt that would be fine and he could pick it up today.

## 2018-01-07 NOTE — Telephone Encounter (Signed)
Left message for pt to call for results  

## 2018-01-14 ENCOUNTER — Encounter (INDEPENDENT_AMBULATORY_CARE_PROVIDER_SITE_OTHER): Payer: BC Managed Care – PPO | Admitting: Sports Medicine

## 2018-01-14 DIAGNOSIS — M7751 Other enthesopathy of right foot: Secondary | ICD-10-CM | POA: Diagnosis not present

## 2018-01-14 DIAGNOSIS — M7750 Other enthesopathy of unspecified foot: Secondary | ICD-10-CM

## 2018-01-14 NOTE — Addendum Note (Signed)
Addended by: Asencion IslamSTOVER, Latishia Suitt T on: 01/14/2018 04:50 PM   Modules accepted: Orders

## 2018-01-29 ENCOUNTER — Encounter: Payer: Self-pay | Admitting: Sports Medicine

## 2018-01-29 ENCOUNTER — Ambulatory Visit: Payer: BC Managed Care – PPO | Admitting: Sports Medicine

## 2018-01-29 DIAGNOSIS — Z8269 Family history of other diseases of the musculoskeletal system and connective tissue: Secondary | ICD-10-CM

## 2018-01-29 DIAGNOSIS — M7750 Other enthesopathy of unspecified foot: Secondary | ICD-10-CM | POA: Diagnosis not present

## 2018-01-29 DIAGNOSIS — M25572 Pain in left ankle and joints of left foot: Secondary | ICD-10-CM | POA: Diagnosis not present

## 2018-01-29 DIAGNOSIS — M25571 Pain in right ankle and joints of right foot: Secondary | ICD-10-CM | POA: Diagnosis not present

## 2018-01-29 NOTE — Progress Notes (Signed)
  Subjective:  Andri Stueck is a 63 y.o. male patient who presents to office for evaluation left greater than right ankle pain.  Patient states that he currently has no pain; he feels 100% better states that he did have one episode of pain along the right ankle that went away on its own.  Patient reports he had similar warmth redness and intense pain along the ankle and went to his PCP because he thought it was gout a few days later however the uric acid was not elevated and his primary care doctor advised him that it is difficult to diagnose gout based on these few episodes of symptoms that have resolved.  Patient denies any recurrence of redness warmth swelling or pain to the ankles left or right.  Patient Active Problem List   Diagnosis Date Noted  . OSA (obstructive sleep apnea) 05/09/2015  . Chest discomfort 07/03/2014  . HTN (hypertension) 08/11/2012  . Hyperlipidemia 08/11/2012  . Hypertensive heart disease without CHF 08/11/2012  . Other ejaculatory dysfunction 08/11/2012    Current Outpatient Medications on File Prior to Visit  Medication Sig Dispense Refill  . acetaminophen-codeine (TYLENOL #4) 300-60 MG tablet   0  . aspirin 81 MG tablet Take 81 mg by mouth daily.    Marland Kitchen BYSTOLIC 2.5 MG tablet TAKE 1 TABLET BY MOUTH DAILY. 30 tablet 6  . methylPREDNISolone (MEDROL DOSEPAK) 4 MG TBPK tablet Take as directed 21 tablet 0  . potassium chloride SA (K-DUR,KLOR-CON) 20 MEQ tablet   4  . rosuvastatin (CRESTOR) 10 MG tablet Take 10 mg by mouth daily.    Marland Kitchen spironolactone (ALDACTONE) 25 MG tablet Take 25 mg by mouth daily.   7  . TRIBENZOR 40-10-12.5 MG TABS Take 1 tablet by mouth daily. Take 1 tab daily     No current facility-administered medications on file prior to visit.     No Known Allergies  Objective:  General: Alert and oriented x3 in no acute distress  Dermatology: No open lesions bilateral lower extremities, no webspace macerations, no ecchymosis bilateral, all nails x  10 are well manicured.  Vascular: Dorsalis Pedis and Posterior Tibial pedal pulses palpable, Capillary Fill Time 3 seconds,(+) pedal hair growth bilateral, no edema bilateral lower extremities, Temperature gradient within normal limits.  Neurology: Michaell Cowing sensation intact via light touch bilateral.  Musculoskeletal: No tenderness with palpation at ankles bilateral.  Strength within normal limits in all groups bilateral however mild guarding due to pain.   Gait: Non antalgic gait  Assessment and Plan: Problem List Items Addressed This Visit    None    Visit Diagnoses    Tendinitis of ankle or foot    -  Primary   Bilateral ankle pain, unspecified chronicity       Family history of gout           -Complete examination performed -Discussed treatement options for possible gout and history of tendon strain on left ankle as proven by MRI -Continue with supportive shoes and activities that do not aggravate his ankles -Advised patient to closely monitor for recurrence of gout and gave him general information since his family history is positive for gout -Advised patient prophylactically he can take cherry tablets -Patient to return to office as needed or sooner if condition worsens.  Asencion Islam, DPM

## 2018-01-29 NOTE — Patient Instructions (Signed)

## 2018-10-30 ENCOUNTER — Encounter: Payer: Self-pay | Admitting: Neurology

## 2018-10-30 ENCOUNTER — Other Ambulatory Visit: Payer: Self-pay

## 2018-10-30 ENCOUNTER — Ambulatory Visit: Payer: BC Managed Care – PPO | Admitting: Neurology

## 2018-10-30 VITALS — BP 122/76 | HR 67 | Temp 98.4°F | Ht 67.0 in | Wt 166.0 lb

## 2018-10-30 DIAGNOSIS — R42 Dizziness and giddiness: Secondary | ICD-10-CM

## 2018-10-30 DIAGNOSIS — N189 Chronic kidney disease, unspecified: Secondary | ICD-10-CM | POA: Diagnosis not present

## 2018-10-30 DIAGNOSIS — G4733 Obstructive sleep apnea (adult) (pediatric): Secondary | ICD-10-CM | POA: Diagnosis not present

## 2018-10-30 DIAGNOSIS — R634 Abnormal weight loss: Secondary | ICD-10-CM

## 2018-10-30 DIAGNOSIS — R011 Cardiac murmur, unspecified: Secondary | ICD-10-CM

## 2018-10-30 DIAGNOSIS — H81399 Other peripheral vertigo, unspecified ear: Secondary | ICD-10-CM

## 2018-10-30 NOTE — Patient Instructions (Signed)
Your neurological exam is nonfocal which is reassuring.  Nevertheless, for your recurrent fleeting dizziness, I would recommend proceeding with evaluation with plain brain MRI.  Your elevated creatinine level does not allow for a MRI with contrast safely.  Therefore we will do a brain MRI without contrast and we will call you with the results.  We will seek insurance authorization.In addition, given your sleepApnea history, I would like to suggest a diagnostic sleep study to reevaluate you for your sleep apnea.  You have lost weight.  You may not need to use CPAP any longer but it would be good to know with a proper sleep study.

## 2018-10-30 NOTE — Progress Notes (Signed)
Subjective:    Patient ID: Jeffery Strickland is a 63 y.o. male.  HPI     Star Age, MD, PhD [facility] [address], [address].O. [place], [place] [address]  Dear Dr. Criss Rosales, I saw your patient, Jeffery Strickland, upon your kind request in my neurologic clinic today for initial consultation of his dizziness.  The patient is unaccompanied today.  As you know, Jeffery Strickland is a 63 year old right-handed gentleman with an underlying medical history of hypertension, hyperlipidemia, chronic kidney disease, sleep apnea and overweight state, who reports intermittent dizziness for the past 3 weeks. He has had recurrent, short-lived or fleeting episodes of dizziness.  The first time it happened he was working in the yard and had been sitting.  He got up to go inside and felt a short-lived feeling of off balance, he did not stagger or fall.  He did not feel like the room was spinning, no associated headache, almost like a lightheadedness and funny feeling in the head.  He had about 2 days later another episode while at work, while sitting, he had not changed positions.  He denies any vertigo type spells, denies hearing loss, denies recurrent headaches or one-sided weakness or numbness or tingling or droopy face or slurring of speech.  He is quite active, likes to work in the yard.  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 I reviewed your office note from 10/08/2018.  He had blood work in your office on 10/08/2018 and I reviewed the results: Lipid panel showed total cholesterol of 148, HDL 59, LDL 69, triglycerides 121.  CMP showed: creatinine elevated at 1.57, BUN was 27, glucose 90, otherwise normal findings.  CBC with differential showed normal findings, A1c was 5.8.  He drinks about 2 or 3 of the 8 ounce bottles of water per day during the day and 16 ounce water with dinner.  He typically does not drink any soda or coffee or tea.  He is somewhat overdue with  his cardiac follow-up and has a eye examination pending for this year, he goes once a year.  He has a history of sleep apnea, he stopped using his CPAP machine about a year ago as he lost weight and his snoring improved.  He has not had a reevaluation formally with sleep testing.  He was originally diagnosed with sleep apnea some 4 years ago.  He had some 2 days ago a milder left-sided headache which was not enough to take any medication and it past, he had no associated photophobia, photophobia or neurological accompaniments.  He denies any history of blurry vision.  He has not fallen.  In the past 3 days at least he has not had any symptoms of dizziness.   His Past Medical History Is Significant For: Past Medical History:  Diagnosis Date  . CKD (chronic kidney disease)   . History of echocardiogram    Echo 3/11:  Normal LV function, mild basal septal hypertrophy, mild diastolic dysfunction, no significant valvular abnormalities // Echo 2/17: EF 60-65%, normal wall motion, grade 1 diastolic dysfunction, MAC  . History of nuclear stress test    Myoview 7/16:  EF 64%, normal perfusion, low risk  . Hyperlipemia   . Hypertensive heart disease   . OSA (obstructive sleep apnea) 05/09/2015    His Past Surgical History Is Significant For: Past Surgical History:  Procedure Laterality Date  . NM MYOCAR PERF WALL MOTION  01/11/2010  normal  . NOCTURNAL POLYSOMNOGRAPHY WITH SEIZURE MONTAGE  05/02/2015  . SPLIT NIGHT STUDY  05/02/2015  . US ECHOCARDIOGRAPHY  03/22/2009   mild basal septal LV hypertropher,mild diastolic dysfx,consider early hypertrophic cardiomyopathy    His Family History Is Significant For: Family History  Problem Relation Age of Onset  . Heart attack Father   . Cancer Sister   . Diabetes Sister     His Social History Is Significant For: Social History   Socioeconomic History  . Marital status: Married    Spouse name: Not on file  . Number of children: Not on file  . Years  of education: Not on file  . Highest education level: Not on file  Occupational History  . 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 Financial resource strain: Not on file  . Food insecurity    Worry: Not on file    Inability: Not on file  . Transportation needs    Medical: Not on file    Non-medical: Not on file  Tobacco Use  . Smoking status: Never Smoker  . Smokeless tobacco: Never Used  Substance and Sexual Activity  . Alcohol use: No    Alcohol/week: 0.0 standard drinks  . Drug use: No  . Sexual activity: Not on file  Lifestyle  . Physical activity    Days per week: Not on file    Minutes per session: Not on file  . Stress: Not on file  Relationships  . Social Herbalist on phone: Not on file    Gets together: Not on file    Attends religious service: Not on file    Active member of club or organization: Not on file    Attends meetings of clubs or organizations: Not on file    Relationship status: Not on file  Other Topics Concern  . Not on file  Social History Narrative  . Not on file    His Allergies Are:  No Known Allergies:   His Current Medications Are:  Outpatient Encounter Medications as of 10/30/2018  Medication Sig  . acetaminophen-codeine (TYLENOL #4) 300-60 MG tablet   . aspirin 81 MG tablet Take 81 mg by mouth daily.  Marland Kitchen BYSTOLIC 2.5 MG tablet TAKE 1 TABLET BY MOUTH DAILY.  . methylPREDNISolone (MEDROL DOSEPAK) 4 MG TBPK tablet Take as directed  . potassium chloride SA (K-DUR,KLOR-CON) 20 MEQ tablet   . rosuvastatin (CRESTOR) 10 MG tablet Take 10 mg by mouth daily.  Marland Kitchen spironolactone (ALDACTONE) 25 MG tablet Take 25 mg by mouth daily.   Jabier Gauss 40-10-12.5 MG TABS Take 1 tablet by mouth daily. Take 1 tab daily   No facility-administered encounter medications on file as of 10/30/2018.   :   Review of Systems:  Out of a complete 14 point review of systems, all are  reviewed and negative with the exception of these symptoms as listed below:  Review of Systems  Neurological:       Pt presents today to discuss his dizziness. Pt has not had any dizziness in the past few days. Pt denies any associated falls with his dizziness.    Objective:  Neurological Exam  Physical Exam Physical Examination:   Vitals:   10/30/18 1513  BP: 122/76  Pulse: 67  Temp: 98.4 F (36.9 C)   On orthostatic testing, lying blood pressure and pulse was 112/75 with a pulse of 67, sitting  122/76 with a pulse of 56, standing 118/78 with a pulse of 59.He denies any orthostatic lightheadedness or vertiginous symptoms.  General Examination: The patient is a very pleasant 63 y.o. male in no acute distress. He appears well-developed and well-nourished and well groomed.   HEENT: Normocephalic, atraumatic, pupils are equal, round and reactive to light and accommodation. Funduscopic exam is normal with sharp disc margins noted. Mild bilateral cataracts noted. Extraocular tracking is good without limitation to gaze excursion or nystagmus noted. Normal smooth pursuit is noted. Hearing is grossly intact. Face is symmetric with normal facial animation. Speech is clear with no dysarthria noted. There is no hypophonia. There is no lip, neck/head, jaw or voice tremor. Neck is supple with full range of passive and active motion. There are no carotid bruits on auscultation. Oropharynx exam reveals: mild mouth dryness, adequate dental hygiene and mild airway crowding. Tongue protrudes centrally and palate elevates symmetrically.   Chest: Clear to auscultation without wheezing, rhonchi or crackles noted.  Heart: S1+S2+0, regular and with 1/6 syst murmur.   Abdomen: Soft, non-tender and non-distended with normal bowel sounds appreciated on auscultation.  Extremities: There is no pitting edema in the distal lower extremities bilaterally. Pedal pulses are intact.  Skin: Warm and dry without trophic  changes noted.  Musculoskeletal: exam reveals no obvious joint deformities, tenderness or joint swelling or erythema.   Neurologically:  Mental status: The patient is awake, alert and oriented in all 4 spheres. His immediate and remote memory, attention, language skills and fund of knowledge are appropriate. There is no evidence of aphasia, agnosia, apraxia or anomia. Speech is clear with normal prosody and enunciation. Thought process is linear. Mood is normal and affect is normal.  Cranial nerves II - XII are as described above under HEENT exam. In addition: shoulder shrug is normal with equal shoulder height noted. Motor exam: Normal bulk, strength and tone is noted. There is no drift, tremor or rebound. Romberg is negative. Reflexes are 1+ throughout. Babinski: Toes are flexor bilaterally. Fine motor skills and coordination: intact with normal finger taps, normal hand movements, normal rapid alternating patting, normal foot taps and normal foot agility.  Cerebellar testing: No dysmetria or intention tremor on finger to nose testing. Heel to shin is unremarkable bilaterally. There is no truncal or gait ataxia.  Sensory exam: intact to light touch  in the upper and lower extremities.  Gait, station and balance: He stands easily. No veering to one side is noted. No leaning to one side is noted. Posture is age-appropriate and stance is narrow based. Gait shows normal stride length and normal pace. No problems turning are noted. Tandem walk is unremarkable.           Assessment and Plan:   In summary, Jeffery Strickland is a very pleasant 63 y.o.-year old male with an underlying medical history of hypertension, hyperlipidemia, chronic kidney disease, sleep apnea and overweight state, who Presents for evaluation of his dizzy spells.  He denies any vertiginous symptoms.  He has perhaps had some lightheadedness.  He has no neurological accompaniments.  He is somewhat overdue for his cardiac evaluation which is  yearly typically.  He is also due for his eye checkup.  Neurological examination is nonfocal which is reassuring.  He has no orthostatic symptoms or orthostatic hypotension on testing today.  He does have a history of obstructive sleep apnea and stopped using his CPAP machine about a year ago.  He would benefit from reevaluation with a formal sleep  study.  I would recommend proceeding with a sleep study.  We talked about triggers that can cause dizziness.  Dehydration or suboptimal hydration can be a contributor.  He is encouraged to drink about 6 to 8 cups of water if possible.  We talked about the importance of resting and off and achieving 7 to 8 hours of sleep.  He is willing to proceed with a sleep study.  Furthermore, I would recommend a brain MRI.  Given his chronic kidney disease he is advised to proceed with a plain brain MRI rather than MRI with contrast. He is advised to maintain a healthy lifestyle and stay well-hydrated.  From my end of things I did not suggest any new medications.  We will talk to him about his MRI scheduling and sleep study scheduling and also keep him posted as to his test results.  I plan to see him back after testing.  He is advised to make a follow-up appointment with his cardiologist, as I a noticed a slight heart murmur today.  Furthermore, he is advised to schedule his eye examination or keep his appointment if it is coming up.  He seems to have mild cataracts.  I answered all his questions today and he was in agreement with the plan. Thank you very much for allowing me to participate in the care of this nice patient. If I can be of any further assistance to you please do not hesitate to call me at [phone].  Sincerely,   Star Age, MD, PhD

## 2018-11-04 ENCOUNTER — Encounter (HOSPITAL_COMMUNITY): Payer: Self-pay

## 2018-11-04 ENCOUNTER — Emergency Department (HOSPITAL_COMMUNITY)
Admission: EM | Admit: 2018-11-04 | Discharge: 2018-11-04 | Disposition: A | Discharge status: Home / Self Care | Payer: BC Managed Care – PPO | Attending: Emergency Medicine | Admitting: Emergency Medicine

## 2018-11-04 ENCOUNTER — Other Ambulatory Visit: Payer: Self-pay

## 2018-11-04 DIAGNOSIS — R42 Dizziness and giddiness: Secondary | ICD-10-CM | POA: Insufficient documentation

## 2018-11-04 DIAGNOSIS — I130 Hypertensive heart and chronic kidney disease with heart failure and stage 1 through stage 4 chronic kidney disease, or unspecified chronic kidney disease: Secondary | ICD-10-CM | POA: Insufficient documentation

## 2018-11-04 DIAGNOSIS — N189 Chronic kidney disease, unspecified: Secondary | ICD-10-CM | POA: Diagnosis not present

## 2018-11-04 DIAGNOSIS — I509 Heart failure, unspecified: Secondary | ICD-10-CM | POA: Insufficient documentation

## 2018-11-04 DIAGNOSIS — Z79899 Other long term (current) drug therapy: Secondary | ICD-10-CM | POA: Insufficient documentation

## 2018-11-04 HISTORY — DX: Essential (primary) hypertension: I10

## 2018-11-04 LAB — BASIC METABOLIC PANEL
Anion gap: 10 (ref 5–15)
BUN: 23 mg/dL (ref 8–23)
CO2: 25 mmol/L (ref 22–32)
Calcium: 9.8 mg/dL (ref 8.9–10.3)
Chloride: 102 mmol/L (ref 98–111)
Creatinine, Ser: 1.55 mg/dL — ABNORMAL HIGH (ref 0.61–1.24)
GFR calc Af Amer: 54 mL/min — ABNORMAL LOW (ref >60)
GFR calc non Af Amer: 47 mL/min — ABNORMAL LOW (ref >60)
Glucose, Bld: 117 mg/dL — ABNORMAL HIGH (ref 70–99)
Potassium: 3.8 mmol/L (ref 3.5–5.1)
Sodium: 137 mmol/L (ref 135–145)

## 2018-11-04 LAB — CBC WITH DIFFERENTIAL/PLATELET
Abs Immature Granulocytes: 0 10*3/uL (ref 0.00–0.07)
Basophils Absolute: 0 10*3/uL (ref 0.0–0.1)
Basophils Relative: 1 %
Eosinophils Absolute: 0.1 10*3/uL (ref 0.0–0.5)
Eosinophils Relative: 2 %
HCT: 50.1 % (ref 39.0–52.0)
Hemoglobin: 15.8 g/dL (ref 13.0–17.0)
Immature Granulocytes: 0 %
Lymphocytes Relative: 35 %
Lymphs Abs: 1.6 10*3/uL (ref 0.7–4.0)
MCH: 28.8 pg (ref 26.0–34.0)
MCHC: 31.5 g/dL (ref 30.0–36.0)
MCV: 91.3 fL (ref 80.0–100.0)
Monocytes Absolute: 0.4 10*3/uL (ref 0.1–1.0)
Monocytes Relative: 8 %
Neutro Abs: 2.5 10*3/uL (ref 1.7–7.7)
Neutrophils Relative %: 54 %
Platelets: 195 10*3/uL (ref 150–400)
RBC: 5.49 MIL/uL (ref 4.22–5.81)
RDW: 13.1 % (ref 11.5–15.5)
WBC: 4.5 10*3/uL (ref 4.0–10.5)
nRBC: 0 % (ref 0.0–0.2)

## 2018-11-04 MED ORDER — MECLIZINE HCL 25 MG PO TABS: 25.0000 mg | ORAL_TABLET | Freq: Three times a day (TID) | ORAL | 0 refills | Status: DC | PRN

## 2018-11-04 MED ORDER — SODIUM CHLORIDE 0.9 % IV BOLUS
1000.0000 mL | Freq: Once | INTRAVENOUS | Status: AC
  Administered 2018-11-04: 1000 mL via INTRAVENOUS

## 2018-11-04 NOTE — ED Notes (Signed)
Patient notified his wife via cell that he can have 1 visitor.

## 2018-11-04 NOTE — ED Provider Notes (Signed)
[facility] DEPT Provider Note   CSN: [id number] Arrival date & time: 11/04/18  0705     History   Chief Complaint Chief Complaint  Patient presents with  . Dizziness    HPI Jeffery Strickland is a 63 y.o. male.     HPI  63 year old male presents with dizziness.  Has been having on and off dizziness for about 3 weeks.  The episodes are typically fleeting and last only a few seconds.  Typically happens at rest such as being at work.  However this morning it happened as he awoke.  He has never had that before.  He then did exercising like typical and noticed it happened again while exercising and he felt like he might fall over.  Feels like things are spinning.  No headache, blurry vision, weakness, numbness, or chest pain.  Felt more spinning than typical but also lasted only a few seconds.  No ear pain.  He saw a neurologist a couple days ago and has an MRI scheduled.  He feels completely fine now.  Past Medical History:  Diagnosis Date  . CKD (chronic kidney disease)   . History of echocardiogram    Echo 3/11:  Normal LV function, mild basal septal hypertrophy, mild diastolic dysfunction, no significant valvular abnormalities // Echo 2/17: EF 60-65%, normal wall motion, grade 1 diastolic dysfunction, MAC  . History of nuclear stress test    Myoview 7/16:  EF 64%, normal perfusion, low risk  . Hyperlipemia   . Hypertension   . Hypertensive heart disease   . OSA (obstructive sleep apnea) 05/09/2015    Patient Active Problem List   Diagnosis Date Noted  . OSA (obstructive sleep apnea) 05/09/2015  . Chest discomfort 07/03/2014  . HTN (hypertension) 08/11/2012  . Hyperlipidemia 08/11/2012  . Hypertensive heart disease without CHF 08/11/2012  . Other ejaculatory dysfunction 08/11/2012    Past Surgical History:  Procedure Laterality Date  . NM MYOCAR PERF WALL MOTION  01/11/2010   normal  . NOCTURNAL POLYSOMNOGRAPHY WITH SEIZURE MONTAGE  05/02/2015   . SPLIT NIGHT STUDY  05/02/2015  . US ECHOCARDIOGRAPHY  03/22/2009   mild basal septal LV hypertropher,mild diastolic dysfx,consider early hypertrophic cardiomyopathy        Home Medications    Prior to Admission medications   Medication Sig Start Date End Date Taking? Authorizing Provider  acetaminophen-codeine (TYLENOL #4) 300-60 MG tablet  11/08/17   [provider]  aspirin 81 MG tablet Take 81 mg by mouth daily.    [provider]  BYSTOLIC 2.5 MG tablet TAKE 1 TABLET BY MOUTH DAILY. 09/13/15   Croitoru, Mihai, MD  meclizine (ANTIVERT) 25 MG tablet Take 1 tablet (25 mg total) by mouth 3 (three) times daily as needed for dizziness. 11/04/18   Sherwood Gambler, MD  methylPREDNISolone (MEDROL DOSEPAK) 4 MG TBPK tablet Take as directed 12/25/17   Landis Martins, DPM  potassium chloride SA (K-DUR,KLOR-CON) 20 MEQ tablet  12/17/17   [provider]  rosuvastatin (CRESTOR) 10 MG tablet Take 10 mg by mouth daily.    [provider]  spironolactone (ALDACTONE) 25 MG tablet Take 25 mg by mouth daily.  02/06/15   [provider]  TRIBENZOR 40-10-12.5 MG TABS Take 1 tablet by mouth daily. Take 1 tab daily 05/22/14   [provider]    Family History Family History  Problem Relation Age of Onset  . Heart attack Father   . Cancer Sister   . Diabetes Sister  Social History Social History   Tobacco Use  . Smoking status: Never Smoker  . Smokeless tobacco: Never Used  Substance Use Topics  . Alcohol use: No    Alcohol/week: 0.0 standard drinks  . Drug use: No     Allergies   Patient has no known allergies.   Review of Systems Review of Systems  Constitutional: Negative for fever.  Eyes: Negative for visual disturbance.  Cardiovascular: Negative for chest pain.  Gastrointestinal: Negative for nausea and vomiting.  Neurological: Positive for dizziness. Negative for weakness, numbness and headaches.  All other systems reviewed  and are negative.    Physical Exam Updated Vital Signs BP 119/78   Pulse (!) 51   Temp 98 F (36.7 C) (Oral)   Resp 12   Ht _0  (1.702 m)   Wt 75.3 kg   SpO2 96%   BMI 26.00 kg/m   Physical Exam Vitals signs and nursing note reviewed.  Constitutional:      Appearance: He is well-developed.  HENT:     Head: Normocephalic and atraumatic.     Right Ear: Tympanic membrane and external ear normal.     Left Ear: External ear normal.     Ears:     Comments: Left TM obscured by wax    Nose: Nose normal.  Eyes:     General:        Right eye: No discharge.        Left eye: No discharge.     Extraocular Movements: Extraocular movements intact.     Pupils: Pupils are equal, round, and reactive to light.  Neck:     Musculoskeletal: Neck supple.  Cardiovascular:     Rate and Rhythm: Regular rhythm. Bradycardia present.     Heart sounds: Normal heart sounds.  Pulmonary:     Effort: Pulmonary effort is normal.     Breath sounds: Normal breath sounds.  Abdominal:     Palpations: Abdomen is soft.     Tenderness: There is no abdominal tenderness.  Skin:    General: Skin is warm and dry.  Neurological:     Mental Status: He is alert.     Comments: CN 3-12 grossly intact. 5/5 strength in all 4 extremities. Grossly normal sensation. Normal finger to nose. Normal gait.  Psychiatric:        Mood and Affect: Mood is not anxious.      ED Treatments / Results  Labs (all labs ordered are listed, but only abnormal results are displayed) Labs Reviewed  BASIC METABOLIC PANEL - Abnormal; Notable for the following components:      Result Value   Glucose, Bld 117 (*)    Creatinine, Ser 1.55 (*)    GFR calc non Af Amer 47 (*)    GFR calc Af Amer 54 (*)    All other components within normal limits  CBC WITH DIFFERENTIAL/PLATELET    EKG EKG Interpretation  Date/Time:  Monday November 04 2018 08:25:27 EDT Ventricular Rate:  51 PR Interval:    QRS Duration: 95 QT Interval:  397  QTC Calculation: 366 R Axis:   62 Text Interpretation:  Sinus bradycardia Borderline T wave abnormalities T wave changes improved since 2011 Confirmed by Sherwood Gambler [phone]) on 11/04/2018 8:30:42 AM   Radiology No results found.  Procedures Procedures (including critical care time)  Medications Ordered in ED Medications  sodium chloride 0.9 % bolus 1,000 mL (1,000 mLs Intravenous New Bag/Given 11/04/18 0827)     Initial  Impression / Assessment and Plan / ED Course  I have reviewed the triage vital signs and the nursing notes.  Pertinent labs & imaging results that were available during my care of the patient were reviewed by me and considered in my medical decision making (see chart for details).        Patient's episodes today sound like vertigo.  Given how quick on and off they are, this is most likely peripheral vertigo.  He is well-appearing and has no current symptoms and is able to ambulate without difficulty.  My suspicion for stroke is quite low given this as well as how long its been going on.  I do not think he needs emergent MRI though I agree with his outpatient MRI by neurology.  I will refer to ENT.  His labs are at baseline.  Otherwise I will give Antivert as needed and we discussed return precautions.  Final Clinical Impressions(s) / ED Diagnoses   Final diagnoses:  Vertigo    ED Discharge Orders         Ordered    meclizine (ANTIVERT) 25 MG tablet  3 times daily PRN     11/04/18 0906           Sherwood Gambler, MD 11/04/18 [phone]

## 2018-11-04 NOTE — Discharge Instructions (Signed)
If you develop headache, fever, neck stiffness, vomiting, blurry or double vision, weakness or numbness in your arms or legs, trouble speaking, intractable dizziness, inability to walk, or any other new/concerning symptoms then return to the ER for evaluation.

## 2018-11-04 NOTE — ED Triage Notes (Signed)
Patient c/o dizziness x 1 month. Patient states he has seen his PCP and a neurologist for the same. Patient states today he woke with dizziness which has never occurred and worsened when he was working out. Patient denies any near syncope feeling.

## 2018-11-05 ENCOUNTER — Telehealth: Payer: Self-pay | Admitting: Neurology

## 2018-11-05 NOTE — Telephone Encounter (Signed)
no to the covid questions MR Brain wo contrast Dr. Reece Leader Auth: [id number] (exp. 11/04/18 to 05/02/19). Patient is scheduled at [facility] for 11/13/18.

## 2018-11-13 ENCOUNTER — Other Ambulatory Visit: Payer: Self-pay

## 2018-11-13 ENCOUNTER — Ambulatory Visit: Payer: BC Managed Care – PPO

## 2018-11-13 DIAGNOSIS — R011 Cardiac murmur, unspecified: Secondary | ICD-10-CM

## 2018-11-13 DIAGNOSIS — N189 Chronic kidney disease, unspecified: Secondary | ICD-10-CM | POA: Diagnosis not present

## 2018-11-13 DIAGNOSIS — R634 Abnormal weight loss: Secondary | ICD-10-CM

## 2018-11-13 DIAGNOSIS — H81399 Other peripheral vertigo, unspecified ear: Secondary | ICD-10-CM

## 2018-11-13 DIAGNOSIS — R42 Dizziness and giddiness: Secondary | ICD-10-CM

## 2018-11-13 DIAGNOSIS — G4733 Obstructive sleep apnea (adult) (pediatric): Secondary | ICD-10-CM

## 2018-11-13 NOTE — Progress Notes (Signed)
Pls call pt: MRI brain without contrast was reported as normal which is reassuring. We will proceed as discussed with a sleep study.  I do not see that he has been scheduled yet.  Please inquire with the sleep lab.

## 2018-11-14 ENCOUNTER — Encounter: Payer: Self-pay | Admitting: Neurology

## 2018-11-14 ENCOUNTER — Telehealth: Payer: Self-pay | Admitting: Neurology

## 2018-11-14 NOTE — Telephone Encounter (Signed)
Called to discuss MRI results there was no answer. LVM for the patient to call back.   **IF pt calls back please advise the patient the MRI of the brain was normal and Dr Rexene Alberts didn't see aything of clinical concern. We will move forward with scheduling a sleep study as soon as we get insurance approval.

## 2018-11-14 NOTE — Telephone Encounter (Signed)
-----   Message from Star Age, MD sent at 11/13/2018  6:10 PM EDT ----- Pls call pt: MRI brain without contrast was reported as normal which is reassuring. We will proceed as discussed with a sleep study.  I do not see that he has been scheduled yet.  Please inquire with the sleep lab.

## 2018-11-20 ENCOUNTER — Telehealth: Payer: Self-pay

## 2018-11-20 NOTE — Telephone Encounter (Signed)
Pt can not schedule sleep study at this time. Pt's mother has fallen ill and he is having to spend a lot of his time taking care of her right now. Pt asked if he could put off sleep study for now. I advised pt that his sleep study order was good for 1 year and that he call me back when ready to schedule.

## 2018-11-25 NOTE — Telephone Encounter (Signed)
I called pt to discuss his MRI results. No answer, left a message asking him to call me back. 

## 2018-11-26 NOTE — Telephone Encounter (Signed)
Pt returned my call. I advised him of his MRI results and recommendations. Pt declined the sleep study right now but will call us back when he is ready to schedule it. Pt verbalized understanding of results. Pt had no questions at this time but was encouraged to call back if questions arise.

## 2019-01-03 ENCOUNTER — Encounter: Payer: Self-pay | Admitting: Cardiovascular Disease

## 2019-01-03 ENCOUNTER — Other Ambulatory Visit: Payer: Self-pay

## 2019-01-03 ENCOUNTER — Ambulatory Visit: Payer: BC Managed Care – PPO | Admitting: Cardiovascular Disease

## 2019-01-03 VITALS — BP 126/81 | HR 56 | Temp 96.8°F | Ht 67.0 in | Wt 166.0 lb

## 2019-01-03 DIAGNOSIS — E7800 Pure hypercholesterolemia, unspecified: Secondary | ICD-10-CM | POA: Diagnosis not present

## 2019-01-03 DIAGNOSIS — Z8669 Personal history of other diseases of the nervous system and sense organs: Secondary | ICD-10-CM | POA: Diagnosis not present

## 2019-01-03 DIAGNOSIS — I10 Essential (primary) hypertension: Secondary | ICD-10-CM | POA: Diagnosis not present

## 2019-01-03 NOTE — Patient Instructions (Signed)
Medication Instructions:  NO CHANGES *If you need a refill on your cardiac medications before your next appointment, please call your pharmacy*  Lab Work: NONE    Follow-Up: At [facility], you and your health needs are our priority.  As part of our continuing mission to provide you with exceptional heart care, we have created designated Provider Care Teams.  These Care Teams include your primary Cardiologist (physician) and Advanced Practice Providers (APPs -  Physician Assistants and Nurse Practitioners) who all work together to provide you with the care you need, when you need it.  Your next appointment:   12 month(s)  The format for your next appointment:   In Person  Provider:   Sanda Klein, MD  Other Instructions

## 2019-01-03 NOTE — Progress Notes (Signed)
Patient ID: Jeffery Strickland, male   DOB: [birth year], 63 y.o.   MRN: [id number]    Cardiology Office Note    Date:  01/03/2019   ID:  Jeffery Strickland, DOB [birth year], MRN [id number]  PCP:  Lucianne Lei, MD  Cardiologist:   Sanda Klein, MD   Chief Complaint  Patient presents with  . HTN        History of Present Illness:  Jeffery Strickland is a 63 y.o. male with HTN-related LVH and hyperlipidemia, OSA.  He has done a great job improving his health.  He is lost substantial weight and exercises regularly.  He rides his bicycle 20 miles every weekend and lifts weights during the week.  He stopped wearing CPAP about a year ago and does not have daytime hypersomnolence or snoring.  He scores only 3 on the Epworth Sleepiness Scale.  The patient specifically denies any chest pain at rest exertion, dyspnea at rest or with exertion, orthopnea, paroxysmal nocturnal dyspnea, syncope, palpitations, focal neurological deficits, intermittent claudication, lower extremity edema, unexplained weight gain, cough, hemoptysis or wheezing.  He is currently only on  His blood pressure is consistently well controlled but he requires 5 different medications in high doses (maximum dose ARB, calcium channel blocker as well as lower doses of beta-blocker, aldosterone antagonist and).  His EKG shows prominent changes of LVH.   Past Medical History:  Diagnosis Date  . CKD (chronic kidney disease)   . History of echocardiogram    Echo 3/11:  Normal LV function, mild basal septal hypertrophy, mild diastolic dysfunction, no significant valvular abnormalities // Echo 2/17: EF 60-65%, normal wall motion, grade 1 diastolic dysfunction, MAC  . History of nuclear stress test    Myoview 7/16:  EF 64%, normal perfusion, low risk  . Hyperlipemia   . Hypertension   . Hypertensive heart disease   . OSA (obstructive sleep apnea) 05/09/2015    Past Surgical History:  Procedure Laterality Date  . NM MYOCAR PERF WALL MOTION   01/11/2010   normal  . NOCTURNAL POLYSOMNOGRAPHY WITH SEIZURE MONTAGE  05/02/2015  . SPLIT NIGHT STUDY  05/02/2015  . US ECHOCARDIOGRAPHY  03/22/2009   mild basal septal LV hypertropher,mild diastolic dysfx,consider early hypertrophic cardiomyopathy    Current Medications: Outpatient Medications Prior to Visit  Medication Sig Dispense Refill  . acetaminophen-codeine (TYLENOL #4) 300-60 MG tablet   0  . aspirin 81 MG tablet Take 81 mg by mouth daily.    Marland Kitchen BYSTOLIC 2.5 MG tablet TAKE 1 TABLET BY MOUTH DAILY. 30 tablet 6  . meclizine (ANTIVERT) 25 MG tablet Take 1 tablet (25 mg total) by mouth 3 (three) times daily as needed for dizziness. 10 tablet 0  . potassium chloride SA (K-DUR,KLOR-CON) 20 MEQ tablet   4  . rosuvastatin (CRESTOR) 10 MG tablet Take 10 mg by mouth daily.    Marland Kitchen spironolactone (ALDACTONE) 25 MG tablet Take 25 mg by mouth daily.   7  . TRIBENZOR 40-10-12.5 MG TABS Take 1 tablet by mouth daily. Take 1 tab daily    . methylPREDNISolone (MEDROL DOSEPAK) 4 MG TBPK tablet Take as directed 21 tablet 0   No facility-administered medications prior to visit.     Allergies:   Patient has no known allergies.   Social History   Socioeconomic History  . Marital status: Married    Spouse name: Not on file  . Number of children: Not on file  . Years of education: Not on file  . Highest education  level: Not on file  Occupational History  . Occupation: Chief Operating Officer: [employer]    Comment: [employer]  Tobacco Use  . Smoking status: Never Smoker  . Smokeless tobacco: Never Used  Substance and Sexual Activity  . Alcohol use: No    Alcohol/week: 0.0 standard drinks  . Drug use: No  . Sexual activity: Not on file  Other Topics Concern  . Not on file  Social History Narrative  . Not on file   Social Determinants of Health   Financial Resource Strain:   . Difficulty of Paying Living Expenses: Not on file  Food Insecurity:   . Worried About  Charity fundraiser in the Last Year: Not on file  . Ran Out of Food in the Last Year: Not on file  Transportation Needs:   . Lack of Transportation (Medical): Not on file  . Lack of Transportation (Non-Medical): Not on file  Physical Activity:   . Days of Exercise per Week: Not on file  . Minutes of Exercise per Session: Not on file  Stress:   . Feeling of Stress : Not on file  Social Connections:   . Frequency of Communication with Friends and Family: Not on file  . Frequency of Social Gatherings with Friends and Family: Not on file  . Attends Religious Services: Not on file  . Active Member of Clubs or Organizations: Not on file  . Attends Archivist Meetings: Not on file  . Marital Status: Not on file     Family History:  The patient's family history includes Cancer in his sister; Diabetes in his sister; Heart attack in his father.  His mother had multiple medical problems including congestive heart failure and died at age 29 just recently.  ROS:   Please see the history of present illness.    ROS All other systems are reviewed and are negative.   PHYSICAL EXAM:   VS:  BP 126/81   Pulse (!) 56   Temp (!) 96.8 F (36 C)   Ht _0  (1.702 m)   Wt 166 lb (75.3 kg)   SpO2 96%   BMI 26.00 kg/m     General: Alert, oriented x3, no distress, appears fit Head: no evidence of trauma, PERRL, EOMI, no exophtalmos or lid lag, no myxedema, no xanthelasma; normal ears, nose and oropharynx Neck: normal jugular venous pulsations and no hepatojugular reflux; brisk carotid pulses without delay and no carotid bruits Chest: clear to auscultation, no signs of consolidation by percussion or palpation, normal fremitus, symmetrical and full respiratory excursions Cardiovascular: normal position and quality of the apical impulse, regular rhythm, normal first and second heart sounds, no murmurs, rubs or gallops Abdomen: no tenderness or distention, no masses by palpation, no abnormal  pulsatility or arterial bruits, normal bowel sounds, no hepatosplenomegaly Extremities: no clubbing, cyanosis or edema; 2+ radial, ulnar and brachial pulses bilaterally; 2+ right femoral, posterior tibial and dorsalis pedis pulses; 2+ left femoral, posterior tibial and dorsalis pedis pulses; no subclavian or femoral bruits Neurological: grossly nonfocal Psych: Normal mood and affect   Wt Readings from Last 3 Encounters:  01/03/19 166 lb (75.3 kg)  11/04/18 166 lb (75.3 kg)  10/30/18 166 lb (75.3 kg)      Studies/Labs Reviewed:   EKG:  EKG is ordered today.  It shows sinus bradycardia, left ventricular hypertrophy with secondary repolarization changes especially prominent in leads V4-V6  Labs: 10/08/2018 Total cholesterol 148, HDL 59,  LDL 69, triglycerides 121 Hemoglobin A1c 5.8% Hemoglobin 15.8 Creatinine 1.55, potassium 3.8, normal liver function tests ASSESSMENT:    1. Essential hypertension   2. Pure hypercholesterolemia   3. History of obstructive sleep apnea      PLAN:  In order of problems listed above:  1. HTN with secondary LVH: Diastolic dysfunction seen on echocardiogram, but no clinical symptoms of heart failure despite very active lifestyle. BP is well controlled. No gynecomastia on aldactone.  Normal nuclear study in the past. 2. OSA: It does sound like he is truly "cured" his obstructive sleep apnea with substantial weight loss.  Does not have daytime hypersomnolence.  No longer using CPAP. 3. HLP: Parameters at target on statin.  Note Hemoglobin A1c 5.8%   Medication Adjustments/Labs and Tests Ordered: Current medicines are reviewed at length with the patient today.  Concerns regarding medicines are outlined above.  Medication changes, Labs and Tests ordered today are listed in the Patient Instructions below. Patient Instructions  Medication Instructions:  NO CHANGES *If you need a refill on your cardiac medications before your next appointment, please call  your pharmacy*  Lab Work: NONE    Follow-Up: At [facility], you and your health needs are our priority.  As part of our continuing mission to provide you with exceptional heart care, we have created designated Provider Care Teams.  These Care Teams include your primary Cardiologist (physician) and Advanced Practice Providers (APPs -  Physician Assistants and Nurse Practitioners) who all work together to provide you with the care you need, when you need it.  Your next appointment:   12 month(s)  The format for your next appointment:   In Person  Provider:   Sanda Klein, MD  Other Instructions      Signed, Sanda Klein, MD  01/03/2019 9:44 AM    [address], [place], [place]  [address] Phone: [phone]; Fax: [phone]

## 2019-01-23 ENCOUNTER — Encounter (INDEPENDENT_AMBULATORY_CARE_PROVIDER_SITE_OTHER): Payer: BC Managed Care – PPO | Admitting: Ophthalmology

## 2019-01-23 ENCOUNTER — Other Ambulatory Visit: Payer: Self-pay

## 2019-01-23 DIAGNOSIS — H35033 Hypertensive retinopathy, bilateral: Secondary | ICD-10-CM

## 2019-01-23 DIAGNOSIS — I10 Essential (primary) hypertension: Secondary | ICD-10-CM | POA: Diagnosis not present

## 2019-01-23 DIAGNOSIS — H348321 Tributary (branch) retinal vein occlusion, left eye, with retinal neovascularization: Secondary | ICD-10-CM

## 2019-01-23 DIAGNOSIS — H43813 Vitreous degeneration, bilateral: Secondary | ICD-10-CM

## 2019-01-23 DIAGNOSIS — H353111 Nonexudative age-related macular degeneration, right eye, early dry stage: Secondary | ICD-10-CM

## 2019-01-23 DIAGNOSIS — H353122 Nonexudative age-related macular degeneration, left eye, intermediate dry stage: Secondary | ICD-10-CM | POA: Diagnosis not present

## 2019-01-23 DIAGNOSIS — E113391 Type 2 diabetes mellitus with moderate nonproliferative diabetic retinopathy without macular edema, right eye: Secondary | ICD-10-CM

## 2019-01-23 DIAGNOSIS — E11319 Type 2 diabetes mellitus with unspecified diabetic retinopathy without macular edema: Secondary | ICD-10-CM

## 2019-01-23 DIAGNOSIS — H2513 Age-related nuclear cataract, bilateral: Secondary | ICD-10-CM

## 2019-03-15 ENCOUNTER — Ambulatory Visit: Payer: BC Managed Care – PPO | Attending: Internal Medicine

## 2019-03-15 DIAGNOSIS — Z23 Encounter for immunization: Secondary | ICD-10-CM | POA: Insufficient documentation

## 2019-03-15 NOTE — Progress Notes (Signed)
   Covid-19 Vaccination Clinic  Name:  Jeffery Strickland    MRN: [id number] DOB: [birth year]  03/15/2019  Jeffery Strickland was observed post Covid-19 immunization for 15 minutes without incidence. He was provided with Vaccine Information Sheet and instruction to access the V-Safe system.   Jeffery Strickland was instructed to call 911 with any severe reactions post vaccine: Marland Kitchen Difficulty breathing  . Swelling of your face and throat  . A fast heartbeat  . A bad rash all over your body  . Dizziness and weakness    Immunizations Administered    Name Date Dose VIS Date Route   Pfizer COVID-19 Vaccine 03/15/2019 12:53 PM 0.3 mL 12/27/2018 Intramuscular   Manufacturer: [employer], [employer]   Lot: [id number]   NDC: [address]-1

## 2019-03-28 ENCOUNTER — Other Ambulatory Visit: Payer: Self-pay

## 2019-04-05 ENCOUNTER — Ambulatory Visit: Payer: BC Managed Care – PPO | Attending: Internal Medicine

## 2019-04-05 DIAGNOSIS — Z23 Encounter for immunization: Secondary | ICD-10-CM

## 2019-04-05 NOTE — Progress Notes (Signed)
   Covid-19 Vaccination Clinic  Name:  Jeffery Strickland    MRN: [id number] DOB: 1955/02/24  04/05/2019  Jeffery Strickland was observed post Covid-19 immunization for 15 minutes without incident. He was provided with Vaccine Information Sheet and instruction to access the V-Safe system.   Jeffery Strickland was instructed to call 911 with any severe reactions post vaccine: Marland Kitchen Difficulty breathing  . Swelling of face and throat  . A fast heartbeat  . A bad rash all over body  . Dizziness and weakness   Immunizations Administered    Name Date Dose VIS Date Route   Pfizer COVID-19 Vaccine 04/05/2019 12:14 PM 0.3 mL 12/27/2018 Intramuscular   Manufacturer: [employer], [employer]   Lot: [id number]   NDC: [address]-8

## 2019-04-09 ENCOUNTER — Ambulatory Visit: Payer: BC Managed Care – PPO

## 2019-05-12 NOTE — Progress Notes (Addendum)
Cardiology Clinic Note   Patient Name: Jeffery Strickland Date of Encounter: 05/13/2019  Primary Care Provider:  Lucianne Lei, MD Primary Cardiologist:  Sanda Klein, MD  Patient Profile     Jeffery Strickland 64 year old male.  Presents today for follow-up of his hypertension, and hyperlipidemia.  Past Medical History    Past Medical History:  Diagnosis Date  . CKD (chronic kidney disease)   . History of echocardiogram    Echo 3/11:  Normal LV function, mild basal septal hypertrophy, mild diastolic dysfunction, no significant valvular abnormalities // Echo 2/17: EF 60-65%, normal wall motion, grade 1 diastolic dysfunction, MAC  . History of nuclear stress test    Myoview 7/16:  EF 64%, normal perfusion, low risk  . Hyperlipemia   . Hypertension   . Hypertensive heart disease   . OSA (obstructive sleep apnea) 05/09/2015   Past Surgical History:  Procedure Laterality Date  . NM MYOCAR PERF WALL MOTION  01/11/2010   normal  . NOCTURNAL POLYSOMNOGRAPHY WITH SEIZURE MONTAGE  05/02/2015  . SPLIT NIGHT STUDY  05/02/2015  . US ECHOCARDIOGRAPHY  03/22/2009   mild basal septal LV hypertropher,mild diastolic dysfx,consider early hypertrophic cardiomyopathy    Allergies  No Known Allergies  History of Present Illness    Mr. [name] has past medical history of hypertension related to LVH, hyperlipidemia, and OSA.  His echocardiogram from 2011 showed normal LV function, mild basal septal hypertrophy, mild DD.  Echocardiogram 2/17 showed an EF of 60-65% normal wall motion, and G1 DD.  Nuclear stress test 7/16 showed an EF of 64% normal perfusion, low risk.  He was last seen by Dr. Sallyanne Kuster on 01/03/2019.  At that time he was doing well.  He continued to be physically active riding his bicycle on weekends and lifting weights through the week.  He continues to use his CPAP and had a score of 3 on his Epworth sleep scale.  He denied chest pain with exertion, dyspnea, orthopnea, syncope,  palpitations and lower extremity edema.  He continued his 5 blood pressure medications.  His blood pressure was well controlled and his EKG showed LVH.  He presents to the clinic today for follow-up evaluation and states 2 weeks ago he noticed some intermittent chest discomfort.  His chest discomfort he describes as light and lasting for only a few seconds at a time.  He said that he had repeat episodes hours apart.  He noticed the onset of his chest discomfort after a 3-hour bike ride.  He did not remember any differences within his previous bike ride related to more intense activity, accidents, swerving to miss cars etc.  He did say that he had increase the repetitions of one of his pectoral lifting exercises/chest flies that week.  He continues to ride his bike on weekends anywhere and lift weights through the week.  He eats a high-fiber low-sodium diet and is compliant with his medications.  He has received both of his COVID-19 vaccinations.  Blood pressure well controlled today.  I will have him follow-up with Dr. Loletha Grayer in 1 year.  Today he denies chest pain, shortness of breath, lower extremity edema, fatigue, palpitations, melena, hematuria, hemoptysis, diaphoresis, weakness, presyncope, syncope, orthopnea, and PND.   Home Medications    Prior to Admission medications   Medication Sig Start Date End Date Taking? Authorizing Provider  acetaminophen-codeine (TYLENOL #4) 300-60 MG tablet  11/08/17   [provider]  aspirin 81 MG tablet Take 81 mg by mouth daily.  [provider]  BYSTOLIC 2.5 MG tablet TAKE 1 TABLET BY MOUTH DAILY. 09/13/15   Croitoru, Mihai, MD  meclizine (ANTIVERT) 25 MG tablet Take 1 tablet (25 mg total) by mouth 3 (three) times daily as needed for dizziness. 11/04/18   Sherwood Gambler, MD  potassium chloride SA (K-DUR,KLOR-CON) 20 MEQ tablet  12/17/17   [provider]  rosuvastatin (CRESTOR) 10 MG tablet Take 10 mg by mouth daily.    [provider]  spironolactone (ALDACTONE) 25 MG tablet Take 25 mg by mouth daily.  02/06/15   [provider]  TRIBENZOR 40-10-12.5 MG TABS Take 1 tablet by mouth daily. Take 1 tab daily 05/22/14   [provider]    Family History    Family History  Problem Relation Age of Onset  . Heart attack Father   . Cancer Sister   . Diabetes Sister    He indicated that his father is deceased. He indicated that one of his three sisters is deceased.  Social History    Social History   Socioeconomic History  . Marital status: Married    Spouse name: Not on file  . Number of children: Not on file  . Years of education: Not on file  . Highest education level: Not on file  Occupational History  . Occupation: Chief Operating Officer: [employer]    Comment: [employer]  Tobacco Use  . Smoking status: Never Smoker  . Smokeless tobacco: Never Used  Substance and Sexual Activity  . Alcohol use: No    Alcohol/week: 0.0 standard drinks  . Drug use: No  . Sexual activity: Not on file  Other Topics Concern  . Not on file  Social History Narrative  . Not on file   Social Determinants of Health   Financial Resource Strain:   . Difficulty of Paying Living Expenses:   Food Insecurity:   . Worried About Charity fundraiser in the Last Year:   . Arboriculturist in the Last Year:   Transportation Needs:   . Film/video editor (Medical):   Marland Kitchen Lack of Transportation (Non-Medical):   Physical Activity:   . Days of Exercise per Week:   . Minutes of Exercise per Session:   Stress:   . Feeling of Stress :   Social Connections:   . Frequency of Communication with Friends and Family:   . Frequency of Social Gatherings with Friends and Family:   . Attends Religious Services:   . Active Member of Clubs or Organizations:   . Attends Archivist Meetings:   Marland Kitchen Marital Status:   Intimate Partner Violence:   . Fear of Current or Ex-Partner:   .  Emotionally Abused:   Marland Kitchen Physically Abused:   . Sexually Abused:      Review of Systems    General:  No chills, fever, night sweats or weight changes.  Cardiovascular:  No chest pain, dyspnea on exertion, edema, orthopnea, palpitations, paroxysmal nocturnal dyspnea. Dermatological: No rash, lesions/masses Respiratory: No cough, dyspnea Urologic: No hematuria, dysuria Abdominal:   No nausea, vomiting, diarrhea, bright red blood per rectum, melena, or hematemesis Neurologic:  No visual changes, wkns, changes in mental status. All other systems reviewed and are otherwise negative except as noted above.  Physical Exam    VS:  BP 116/71 (BP Location: Left Arm, Patient Position: Sitting, Cuff Size: Normal)   Pulse 61   Ht _0  (1.702 m)  Wt 167 lb 9.6 oz (76 kg)   BMI 26.25 kg/m  , BMI Body mass index is 26.25 kg/m. GEN: Well nourished, well developed, in no acute distress. HEENT: normal. Neck: Supple, no JVD, carotid bruits, or masses. Cardiac: RRR, no murmurs, rubs, or gallops. No clubbing, cyanosis, edema.  Radials/DP/PT 2+ and equal bilaterally.  Respiratory:  Respirations regular and unlabored, clear to auscultation bilaterally. GI: Soft, nontender, nondistended, BS + x 4. MS: no deformity or atrophy. Skin: warm and dry, no rash. Neuro:  Strength and sensation are intact. Psych: Normal affect.  Accessory Clinical Findings    ECG personally reviewed by me today-normal sinus rhythm septal infarct undetermined age T wave abnormality consider inferior ischemia 61 bpm- No acute changes  EKG 01/03/2019 Sinus bradycardia, LVH and T wave abnormality consider lateral ischemia 55 bpm  Echocardiogram 03/02/2015  Study Conclusions   - Left ventricle: The cavity size was normal. Wall thickness was  normal. Systolic function was normal. The estimated ejection  fraction was in the range of 60% to 65%. Wall motion was normal;  there were no regional wall motion abnormalities.  Doppler  parameters are consistent with abnormal left ventricular  relaxation (grade 1 diastolic dysfunction).  - Mitral valve: Calcified annulus.     Assessment & Plan   1.  Chest wall pain-no chest pain today.  Incidents happened after a 3-hour bike ride and increased repetitions of his pectoral chest lifting exercises.  Episodes were brief lasting 5 to 10 seconds and resolved with with rest.  Discomfort was present for a day and a half and then resolved. Continue to monitor   Essential hypertension-BP today 116/71.   Continue current medication regimen Heart healthy low-sodium diet Maintain physical activity as tolerated   Hyperlipidemia-LDL 77 on 01/23/2019 Continue statin Heart healthy low-sodium high-fiber diet Increase physical activity as tolerated   Obstructive sleep apnea-continues to maintain his weight.  Continues to not need CPAP. Continue active lifestyle Maintain current weight Heart healthy low-sodium diet  Disposition: Follow-up with Dr. Sallyanne Kuster in 1 year.   Jossie Ng. Yamina Lenis NP-C    05/13/2019, 8:43 AM [address] Office [phone] Fax [phone]

## 2019-05-13 ENCOUNTER — Ambulatory Visit: Payer: BC Managed Care – PPO | Admitting: General Practice

## 2019-05-13 ENCOUNTER — Encounter: Payer: Self-pay | Admitting: General Practice

## 2019-05-13 ENCOUNTER — Other Ambulatory Visit: Payer: Self-pay

## 2019-05-13 VITALS — BP 116/71 | HR 61 | Ht 67.0 in | Wt 167.6 lb

## 2019-05-13 DIAGNOSIS — R0789 Other chest pain: Secondary | ICD-10-CM

## 2019-05-13 DIAGNOSIS — I10 Essential (primary) hypertension: Secondary | ICD-10-CM

## 2019-05-13 DIAGNOSIS — Z8669 Personal history of other diseases of the nervous system and sense organs: Secondary | ICD-10-CM

## 2019-05-13 DIAGNOSIS — E7800 Pure hypercholesterolemia, unspecified: Secondary | ICD-10-CM | POA: Diagnosis not present

## 2019-05-13 NOTE — Patient Instructions (Signed)
Medication Instructions:  The current medical regimen is effective;  continue present plan and medications as directed. Please refer to the Current Medication list given to you today. *If you need a refill on your cardiac medications before your next appointment, please call your pharmacy*  Follow-Up: Your next appointment:  12 month(s) Please call our office 2 months in advance to schedule this appointment In Person with You may see [name], MD Edd Fabian, FNP-C or one of the following Advanced Practice Providers on your designated Care Team:  Azalee Course, PA-C  Micah Flesher, [place] or Judy Pimple, PA-C  At [facility], you and your health needs are our priority.  As part of our continuing mission to provide you with exceptional heart care, we have created designated Provider Care Teams.  These Care Teams include your primary Cardiologist (physician) and Advanced Practice Providers (APPs -  Physician Assistants and Nurse Practitioners) who all work together to provide you with the care you need, when you need it.

## 2019-07-29 IMAGING — MR MR ANKLE*L* W/O CM
5 series · 40 of 40 positions shown · non-contrast
Comparison: None.

CLINICAL DATA: Abdominal left ankle pain along the medial aspect.

EXAM:
MRI OF THE LEFT ANKLE WITHOUT CONTRAST
TECHNIQUE: Multiplanar, multisequence MR imaging of the ankle was performed. No
intravenous contrast was administered.

[Series 4: T2 fat-sat · axial · 3.0mm · 0.50mm/px · z∈[-124,+44]mm · 10 of 44 slices shown (1 of 2)]
[im 1/44]
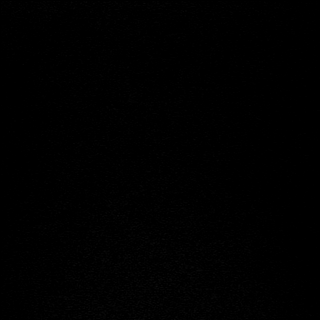
[im 5/44]
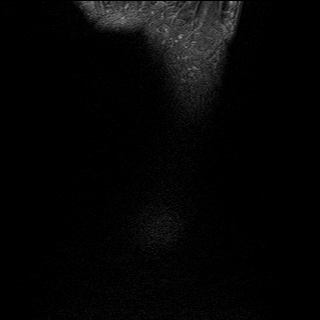
[im 10/44]
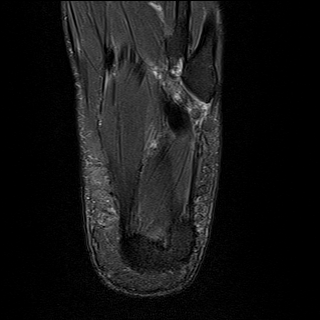
[im 15/44]
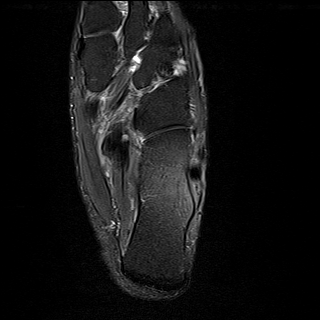
[im 20/44]
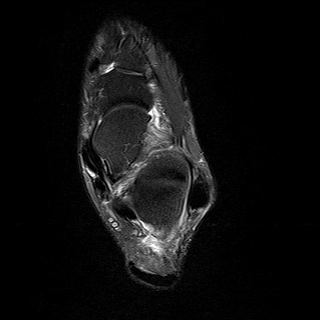
[im 24/44]
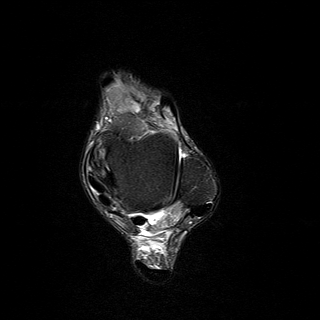
[im 29/44]
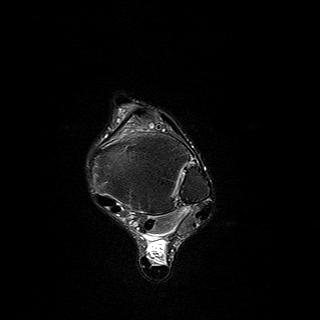
[im 34/44]
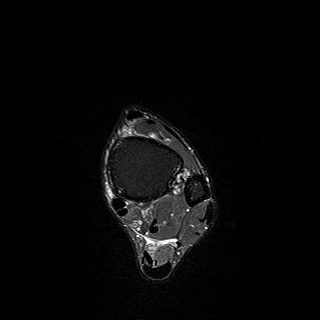
[im 39/44]
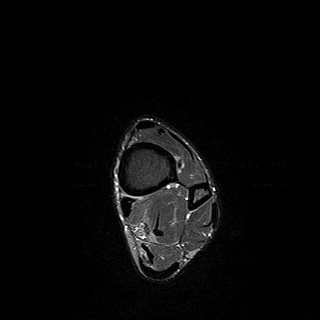
[im 44/44]
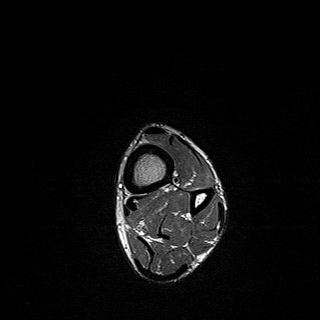

[Series 6: T1 · sagittal · 4.0mm · 0.56mm/px · 5 of 20 slices shown (1 of 2)]
[im 1/20]
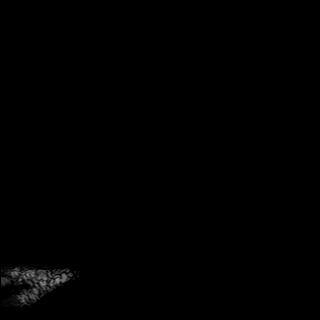
[im 5/20]
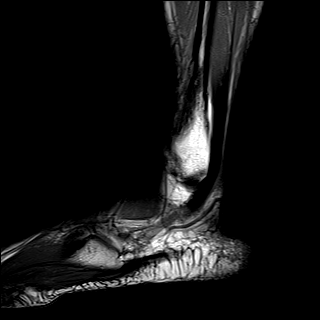
[im 10/20]
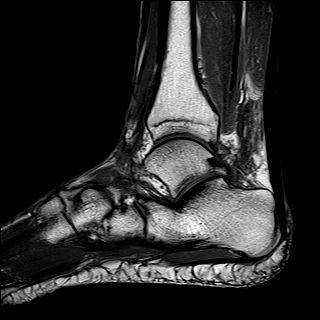
[im 15/20]
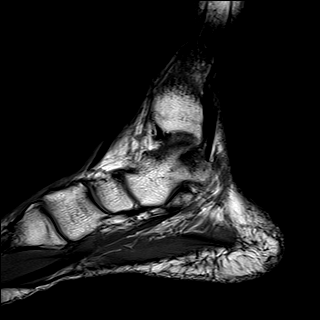
[im 20/20]
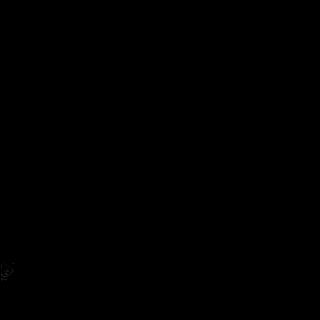

[Series 7: STIR · sagittal · 4.0mm · 0.35mm/px · 5 of 19 slices shown]
[im 1/19]
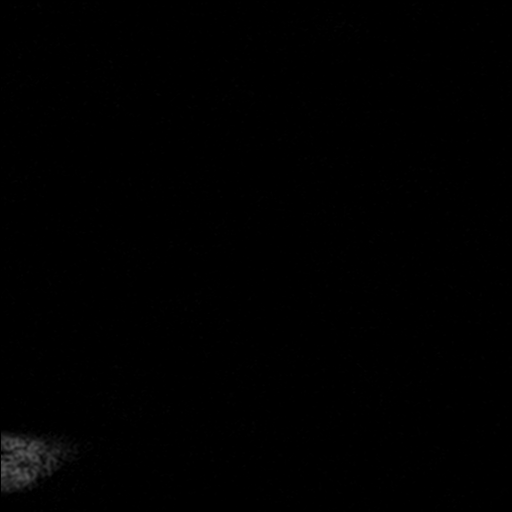
[im 5/19]
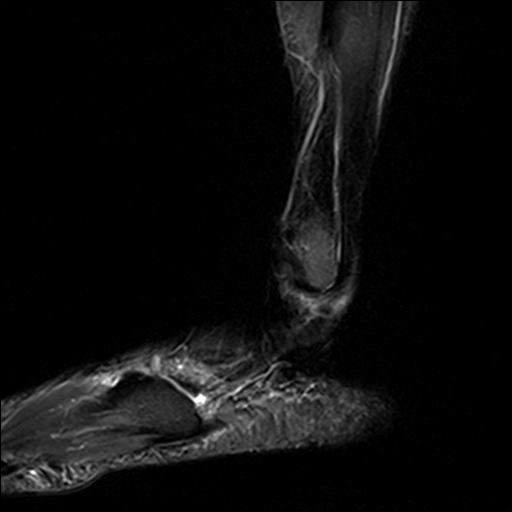
[im 10/19]
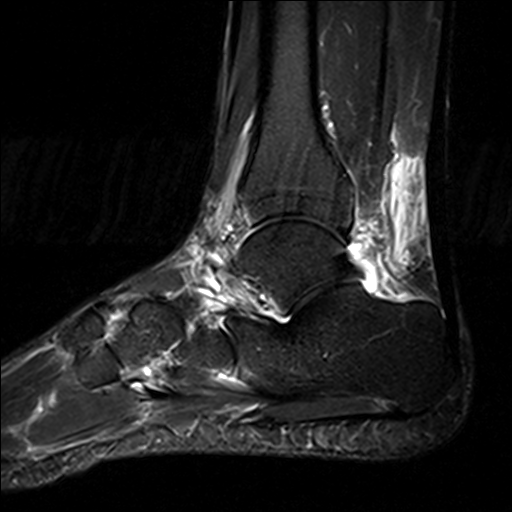
[im 14/19]
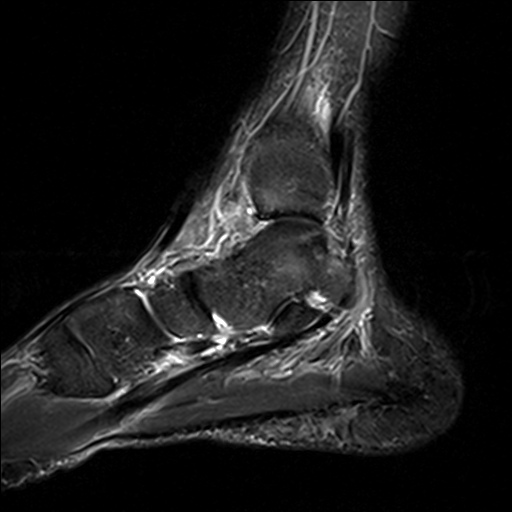
[im 19/19]
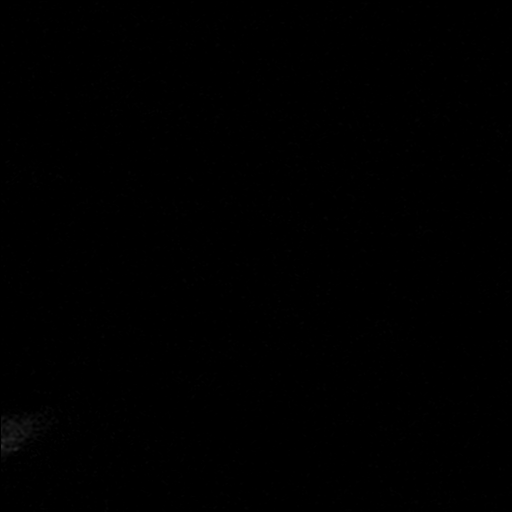

[Series 8: T2 fat-sat · coronal · 3.0mm · 0.50mm/px · 9 of 36 slices shown (2 of 2)]
[im 1/36]
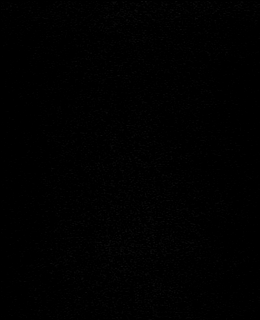
[im 5/36]
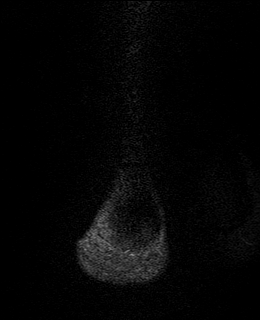
[im 9/36]
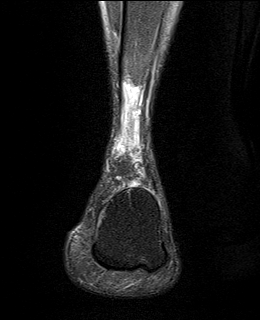
[im 14/36]
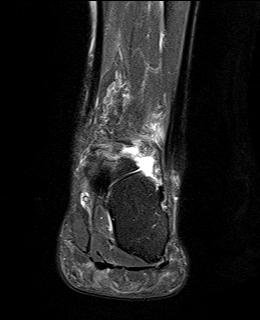
[im 18/36]
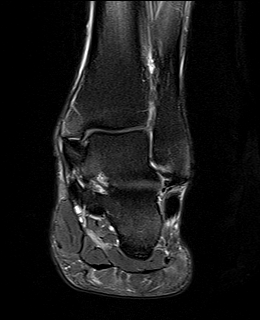
[im 22/36]
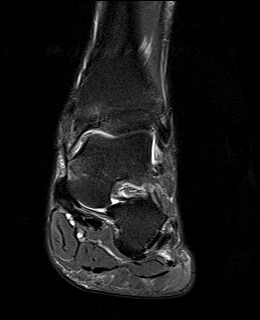
[im 27/36]
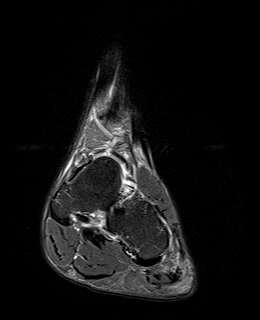
[im 31/36]
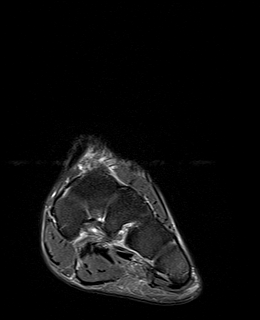
[im 36/36]
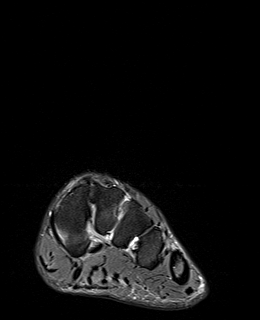

[Series 9: T1 · axial · 3.0mm · 0.50mm/px · z∈[-124,+44]mm · 11 of 44 slices shown (2 of 2)]
[im 1/44]
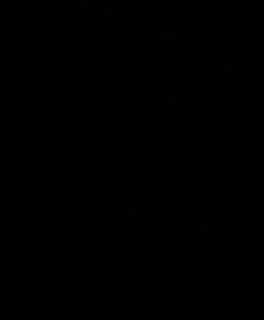
[im 5/44]
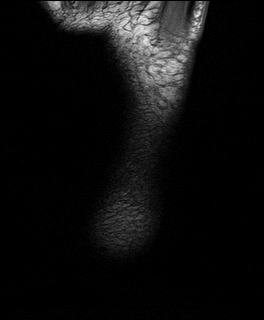
[im 9/44]
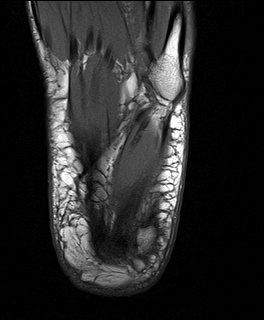
[im 13/44]
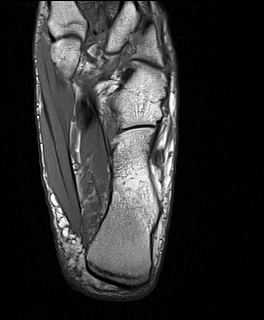
[im 18/44]
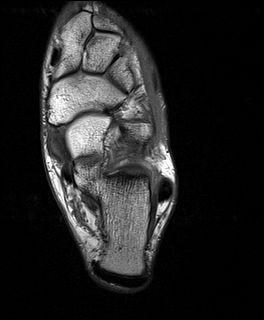
[im 22/44]
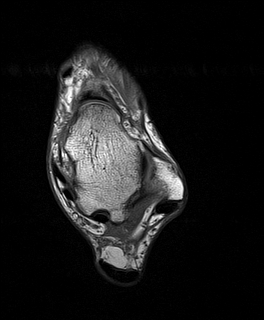
[im 26/44]
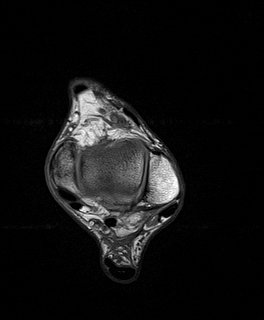
[im 31/44]
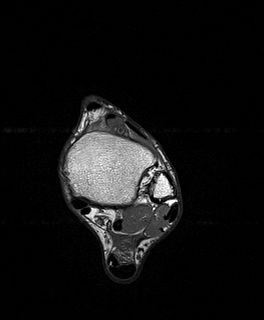
[im 35/44]
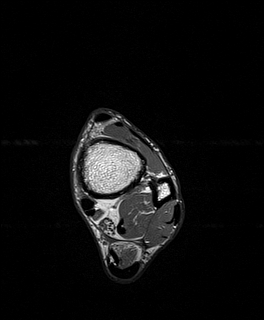
[im 39/44]
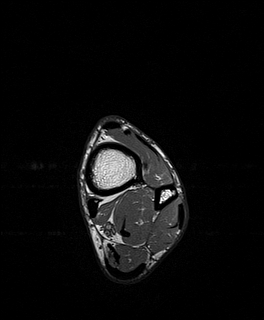
[im 44/44]
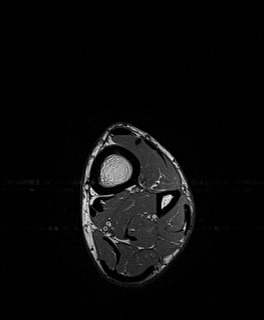

[40 of 40 positions shown; findings below may reference images not displayed]

FINDINGS: TENDONS

Peroneal: Peroneal longus tendon intact. Mild tendinosis of the
peroneus brevis.

Posteromedial: Posterior tibial tendon intact. Flexor hallucis
longus tendon intact. Flexor digitorum longus tendon intact.

Anterior: Tibialis anterior tendon intact. Extensor hallucis longus
tendon intact Extensor digitorum longus tendon intact.

Achilles:  Intact.

Plantar Fascia: Intact.

LIGAMENTS

Lateral: Anterior talofibular ligament intact. Calcaneofibular
ligament intact. Posterior talofibular ligament intact. Anterior and
posterior tibiofibular ligaments intact.

Medial: Increased signal in the proximal deltoid ligament concerning
for ligament strain versus prior injury. No complete tear of the
deltoid ligament. Mild subcortical marrow edema in the medial
malleolus at the deltoid ligament origin and adjacent medial talus.
Spring ligament intact.

CARTILAGE

Ankle Joint: No joint effusion. Normal ankle mortise. No chondral
defect.

Subtalar Joints/Sinus Tarsi: Normal subtalar joints. Small subtalar
joint effusion. Normal sinus tarsi.

Bones: Mild osteoarthritis of the talonavicular joint.

Soft Tissue: No fluid collection or hematoma.  Muscles are normal.
IMPRESSION: 1. Increased signal in the proximal deltoid ligament concerning for
ligament strain versus prior injury. No complete tear of the deltoid
ligament. Mild subcortical marrow edema in the medial malleolus at
the deltoid ligament origin and adjacent medial talus.
2. Mild tendinosis of the peroneus brevis.

## 2019-12-31 ENCOUNTER — Other Ambulatory Visit: Payer: Self-pay | Admitting: Urology

## 2019-12-31 DIAGNOSIS — K862 Cyst of pancreas: Secondary | ICD-10-CM

## 2020-01-06 ENCOUNTER — Other Ambulatory Visit: Payer: Self-pay | Admitting: Urology

## 2020-01-06 ENCOUNTER — Other Ambulatory Visit: Payer: Self-pay

## 2020-01-06 ENCOUNTER — Ambulatory Visit (HOSPITAL_COMMUNITY)
Admission: RE | Admit: 2020-01-06 | Discharge: 2020-01-06 | Disposition: A | Discharge status: Home / Self Care | Payer: BC Managed Care – PPO | Source: Ambulatory Visit | Attending: Urology | Admitting: Urology

## 2020-01-06 DIAGNOSIS — K862 Cyst of pancreas: Secondary | ICD-10-CM | POA: Diagnosis not present

## 2020-01-06 IMAGING — MR MR ABDOMEN WO/W CM
18 of 21 series · 44 of 48 positions shown · IV contrast (gadavist)
Comparison: CT abdomen/pelvis dated 12/04/2019

CLINICAL DATA: Pancreatic and renal cysts

EXAM:
MRI ABDOMEN WITHOUT AND WITH CONTRAST
TECHNIQUE: Multiplanar multisequence MR imaging of the abdomen was performed
both before and after the administration of intravenous contrast.
CONTRAST:  7mL GADAVIST GADOBUTROL 1 MMOL/ML IV SOLN

[Series 3: T2 · coronal · 7.0mm · 1.56mm/px · 2 of 30 slices shown (1 of 2)]
[im 1/30]
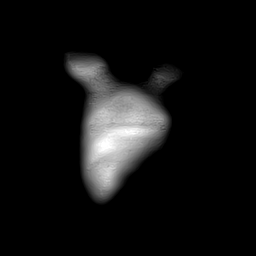
[im 30/30]
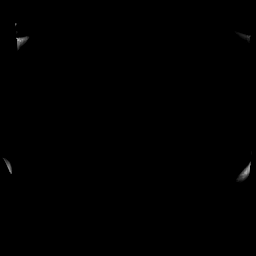

[Series 4: T2 fat-sat · axial · 6.0mm · 1.19mm/px · z∈[-166,+115]mm · 2 of 40 slices shown]
[im 1/40]
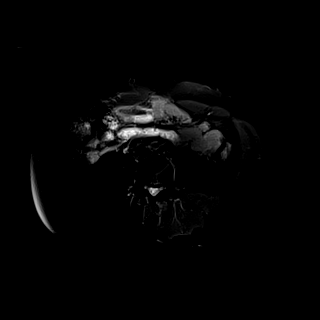
[im 40/40]
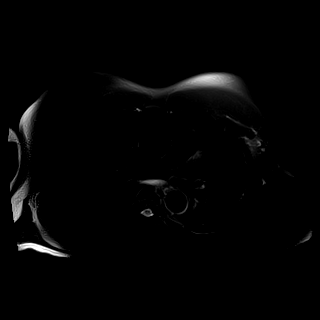

[Series 6: T1 · axial · 3.1mm · 1.25mm/px · z∈[-158,+112]mm · 4 of 88 slices shown (1 of 2)]
[im 1/88]
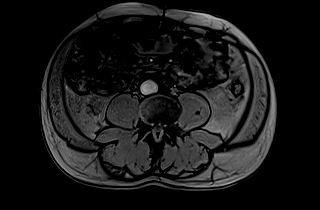
[im 30/88]
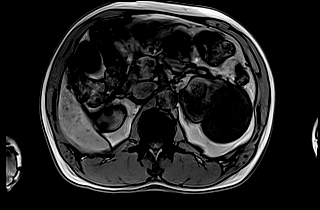
[im 59/88]
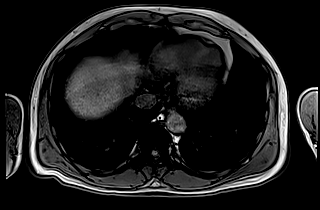
[im 88/88]
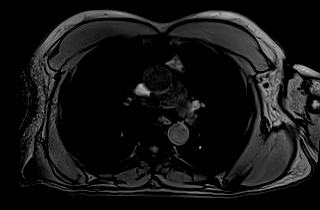

[Series 7: T1 · axial · 3.1mm · 1.25mm/px · z∈[-158,+112]mm · 3 of 88 slices shown (2 of 2)]
[im 1/88]
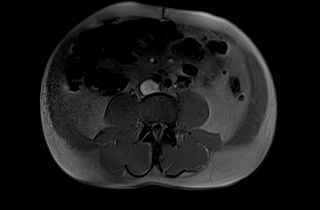
[im 44/88]
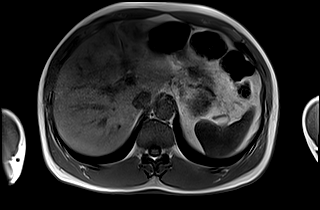
[im 88/88]
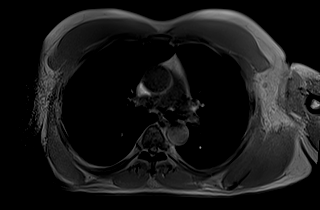

[Series 8: DWI · axial · 6.0mm · 1.49mm/px · z∈[-159,+122]mm · 3 of 80 slices shown (1 of 2)]
[im 1/80]
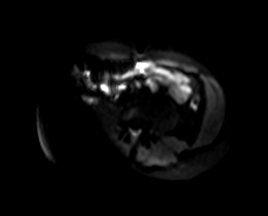
[im 40/80]
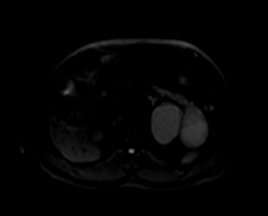
[im 80/80]
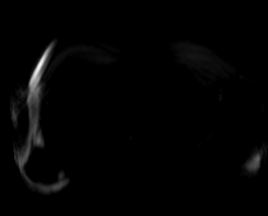

[Series 9: DWI · axial · 6.0mm · 1.49mm/px · 1 of 40 slices shown (2 of 2)]
[im 1/40]
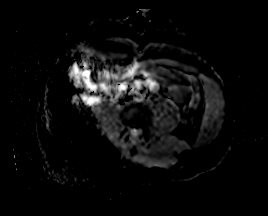

[Series 10: bSSFP · axial · 7.0mm · 1.25mm/px · 1 of 36 slices shown]
[im 1/36]
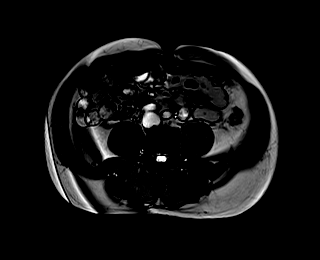

[Series 13: T1 dynamic · axial · 3.0mm · 1.25mm/px · z∈[-156,+105]mm · 3 of 88 slices shown (1 of 6)]
[im 1/88]
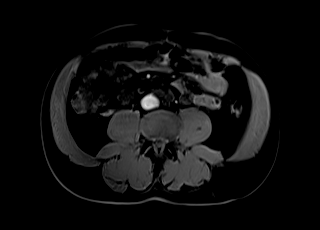
[im 44/88]
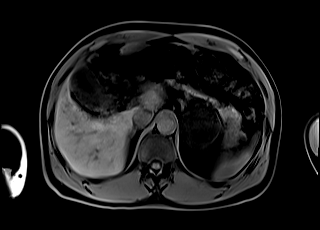
[im 88/88]
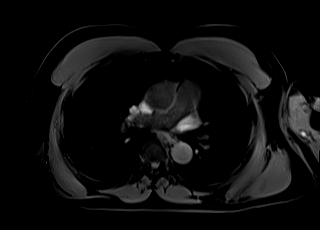

[Series 16: T1 dynamic · axial · 3.0mm · 1.25mm/px · z∈[-156,+105]mm · 3 of 88 slices shown (2 of 6)]
[im 1/88]
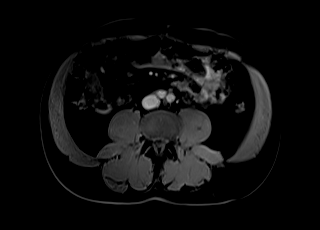
[im 44/88]
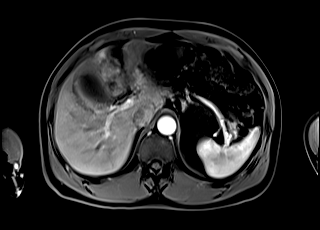
[im 88/88]
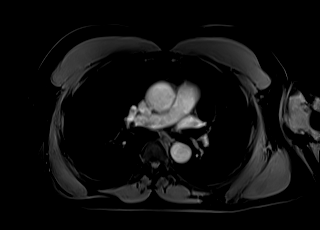

[Series 18: T1 dynamic · axial · 3.0mm · 1.25mm/px · z∈[-156,+105]mm · 3 of 88 slices shown (3 of 6)]
[im 1/88]
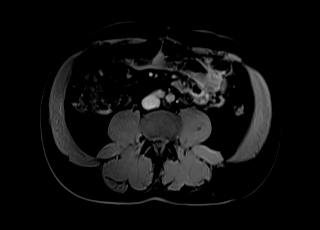
[im 44/88]
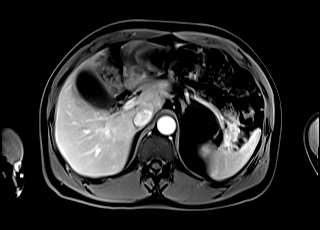
[im 88/88]
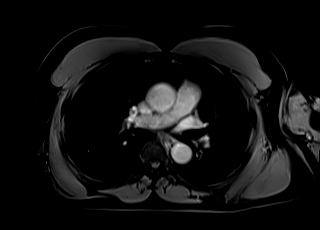

[Series 20: T1 dynamic · axial · 3.0mm · 1.25mm/px · z∈[-156,+105]mm · 3 of 88 slices shown (4 of 6)]
[im 1/88]
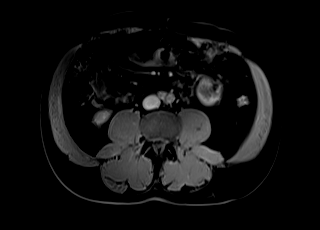
[im 44/88]
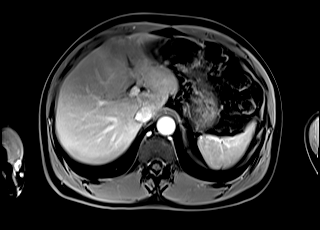
[im 88/88]
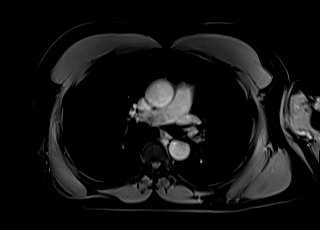

[Series 23: T1 dynamic · coronal · 5.0mm · 1.40mm/px · 2 of 52 slices shown (5 of 6)]
[im 1/52]
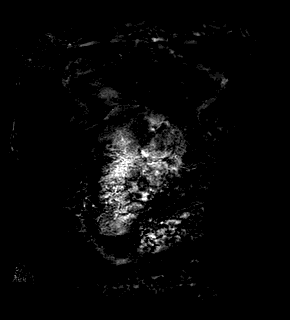
[im 52/52]
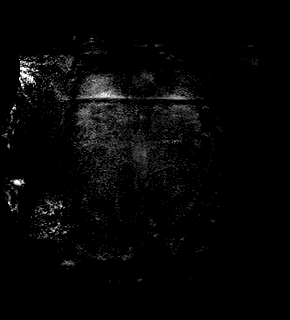

[Series 24: T2 · axial · 6.0mm · 1.56mm/px · 1 of 38 slices shown (2 of 2)]
[im 1/38]
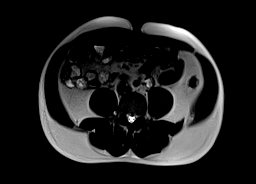

[Series 26: T1 dynamic · axial · 3.0mm · 1.25mm/px · z∈[-156,+105]mm · 3 of 88 slices shown (6 of 6)]
[im 1/88]
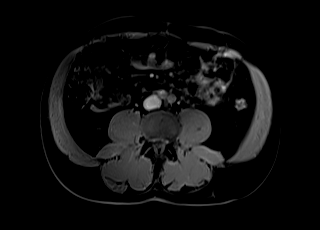
[im 44/88]
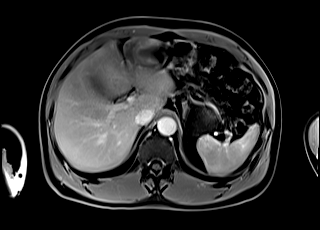
[im 88/88]
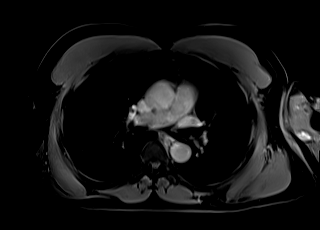

[Series 102: sub_20 sec · axial · 3.0mm · 1.25mm/px · z∈[-156,+105]mm · 3 of 88 slices shown]
[im 1/88]
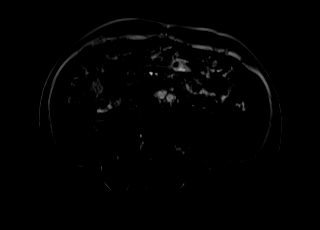
[im 44/88]
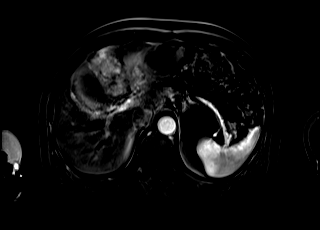
[im 88/88]
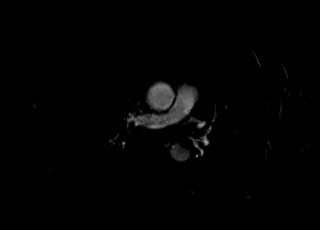

[Series 103: sub_45 sec · axial · 3.0mm · 1.25mm/px · z∈[-156,+105]mm · 3 of 88 slices shown]
[im 1/88]
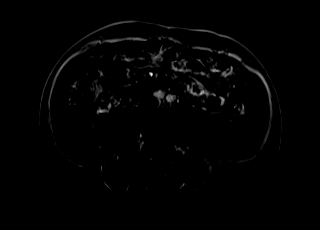
[im 44/88]
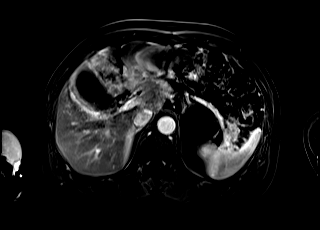
[im 88/88]
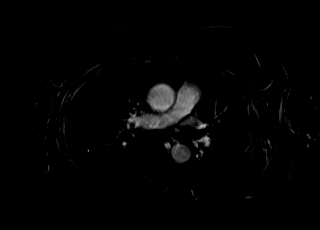

[Series 104: sub_90 sec · axial · 3.0mm · 1.25mm/px · z∈[-156,+105]mm · 3 of 88 slices shown]
[im 1/88]
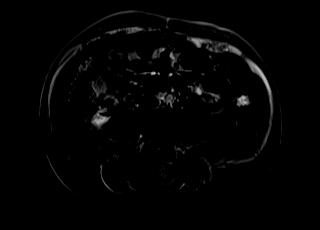
[im 44/88]
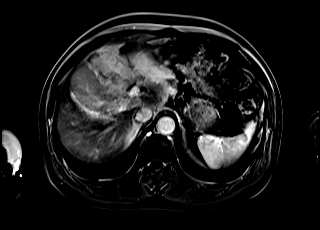
[im 88/88]
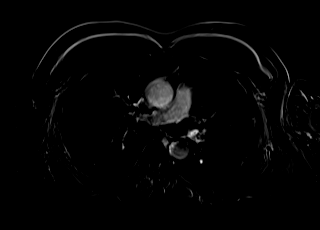

[Series 105: sub_delay · axial · 3.0mm · 1.25mm/px · 1 of 33 slices shown]
[im 1/33]
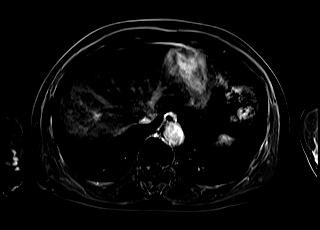

[44 of 48 positions shown; findings below may reference images not displayed]

FINDINGS: Lower chest: Lung bases are clear.

Hepatobiliary: Liver is within normal limits. No
suspicious/enhancing hepatic lesions. No hepatic steatosis.

Gallbladder is unremarkable. No intrahepatic or extrahepatic ductal
dilatation.

Pancreas: 4 x 6 mm unilocular cyst in the pancreatic head (series
4/image 22), without enhancement following contrast administration,
favoring a benign pseudocyst. No pancreatic atrophy or ductal
dilatation.

Spleen:  Within normal limits.

Adrenals/Urinary Tract:  Adrenal glands are within normal limits.

10 mm lesion in the posterior left lower kidney with intrinsic T1
hyperintensity (series 13/image 71), but without enhancement
following contrast administration, suggesting a benign hemorrhagic
cyst (Bosniak II). Additional simple bilateral renal cysts measuring
up to 7.9 cm in the posterior left upper kidney (series 4/image 24),
benign (Bosniak I). No enhancing renal lesions. No hydronephrosis.

Stomach/Bowel: Stomach and visualized bowel are unremarkable.

Vascular/Lymphatic:  No evidence of abdominal aortic aneurysm.

No suspicious abdominal lymphadenopathy.

Other:  No abdominal ascites.

Musculoskeletal: No focal osseous lesions.
IMPRESSION: 6 mm unilocular cyst in the pancreatic head, favoring a benign
pseudocyst.

10 mm hemorrhagic cyst in the posterior left lower kidney, benign
(Bosniak II). Additional simple bilateral renal cysts measuring up
to 7.9 cm in the posterior left upper kidney, benign (Bosniak I). No
enhancing renal lesions.

## 2020-01-06 IMAGING — MR MR 3D RECON AT [facility]
18 of 21 series · 44 of 48 positions shown · IV contrast (gadavist)
Comparison: CT abdomen/pelvis dated 12/04/2019

CLINICAL DATA: Pancreatic and renal cysts

EXAM:
MRI ABDOMEN WITHOUT AND WITH CONTRAST
TECHNIQUE: Multiplanar multisequence MR imaging of the abdomen was performed
both before and after the administration of intravenous contrast.
CONTRAST:  7mL GADAVIST GADOBUTROL 1 MMOL/ML IV SOLN

[Series 3: T2 · coronal · 7.0mm · 1.56mm/px · 2 of 30 slices shown (1 of 2)]
[im 1/30]
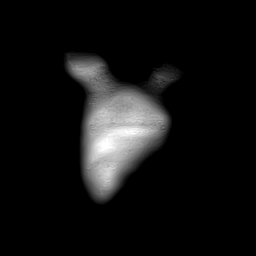
[im 30/30]
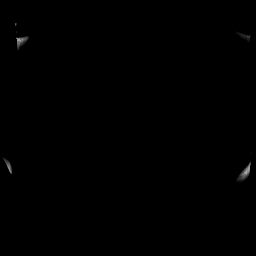

[Series 4: T2 fat-sat · axial · 6.0mm · 1.19mm/px · z∈[-166,+115]mm · 2 of 40 slices shown]
[im 1/40]
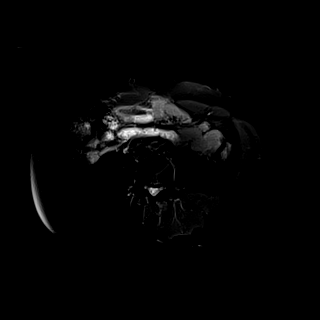
[im 40/40]
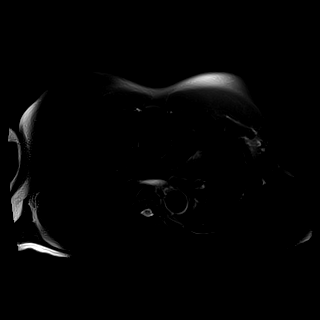

[Series 6: T1 · axial · 3.1mm · 1.25mm/px · z∈[-158,+112]mm · 4 of 88 slices shown (1 of 2)]
[im 1/88]
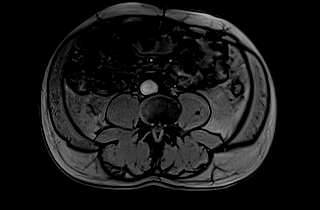
[im 30/88]
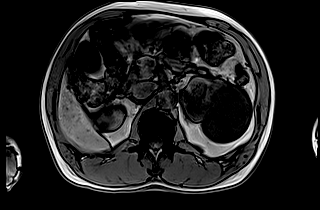
[im 59/88]
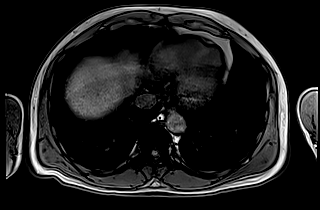
[im 88/88]
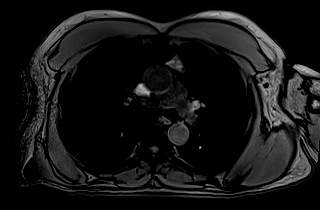

[Series 7: T1 · axial · 3.1mm · 1.25mm/px · z∈[-158,+112]mm · 3 of 88 slices shown (2 of 2)]
[im 1/88]
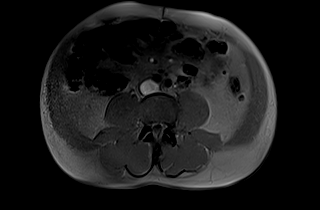
[im 44/88]
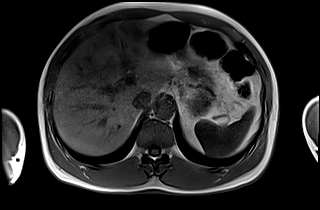
[im 88/88]
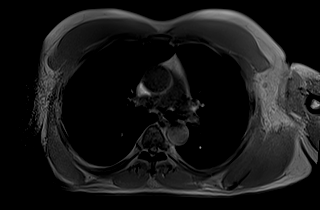

[Series 8: DWI · axial · 6.0mm · 1.49mm/px · z∈[-159,+122]mm · 3 of 80 slices shown (1 of 2)]
[im 1/80]
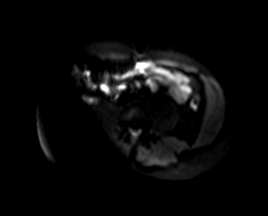
[im 40/80]
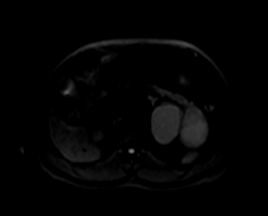
[im 80/80]
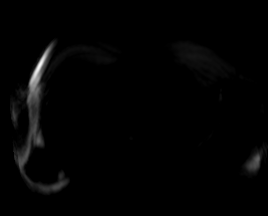

[Series 9: DWI · axial · 6.0mm · 1.49mm/px · 1 of 40 slices shown (2 of 2)]
[im 1/40]
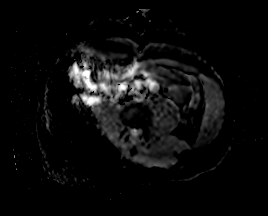

[Series 10: bSSFP · axial · 7.0mm · 1.25mm/px · 1 of 36 slices shown]
[im 1/36]
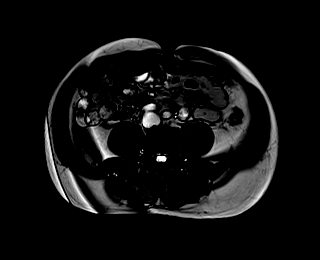

[Series 13: T1 dynamic · axial · 3.0mm · 1.25mm/px · z∈[-156,+105]mm · 3 of 88 slices shown (1 of 6)]
[im 1/88]
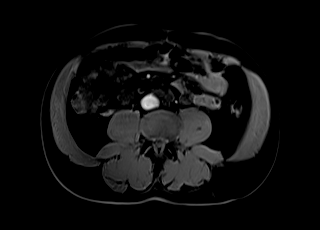
[im 44/88]
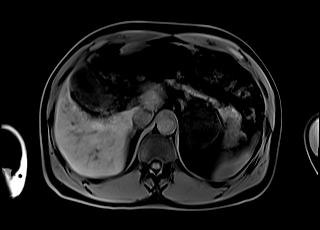
[im 88/88]
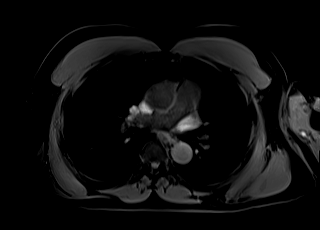

[Series 16: T1 dynamic · axial · 3.0mm · 1.25mm/px · z∈[-156,+105]mm · 3 of 88 slices shown (2 of 6)]
[im 1/88]
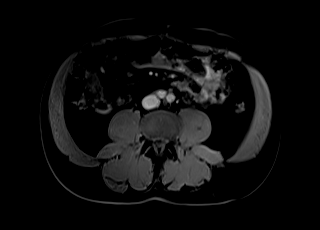
[im 44/88]
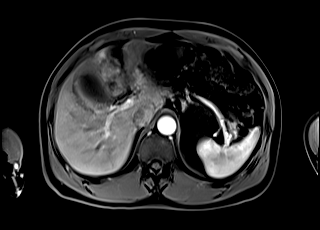
[im 88/88]
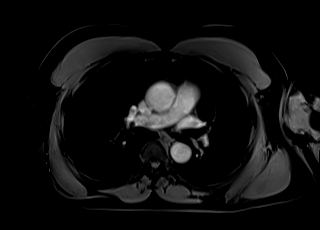

[Series 18: T1 dynamic · axial · 3.0mm · 1.25mm/px · z∈[-156,+105]mm · 3 of 88 slices shown (3 of 6)]
[im 1/88]
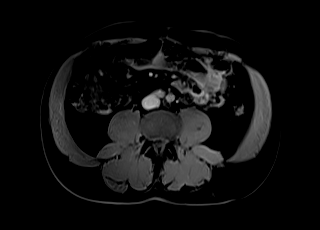
[im 44/88]
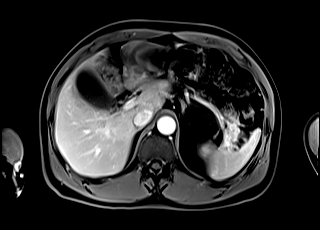
[im 88/88]
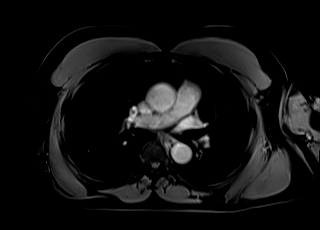

[Series 20: T1 dynamic · axial · 3.0mm · 1.25mm/px · z∈[-156,+105]mm · 3 of 88 slices shown (4 of 6)]
[im 1/88]
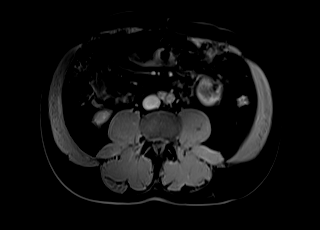
[im 44/88]
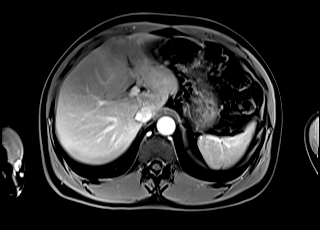
[im 88/88]
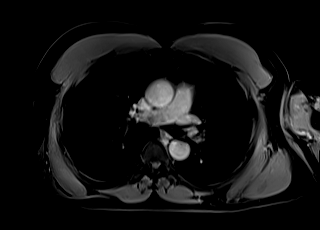

[Series 23: T1 dynamic · coronal · 5.0mm · 1.40mm/px · 2 of 52 slices shown (5 of 6)]
[im 1/52]
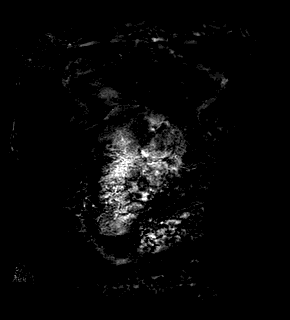
[im 52/52]
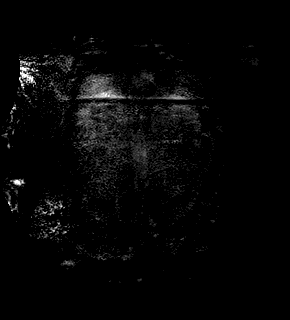

[Series 24: T2 · axial · 6.0mm · 1.56mm/px · 1 of 38 slices shown (2 of 2)]
[im 1/38]
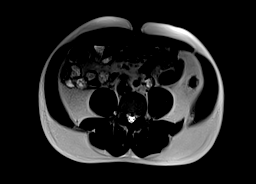

[Series 26: T1 dynamic · axial · 3.0mm · 1.25mm/px · z∈[-156,+105]mm · 3 of 88 slices shown (6 of 6)]
[im 1/88]
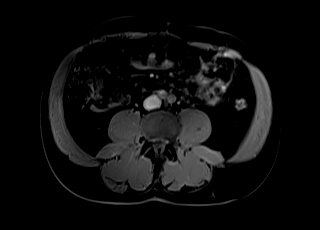
[im 44/88]
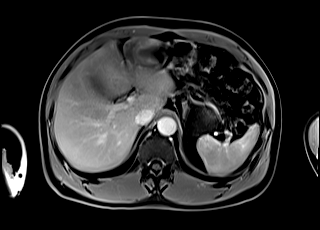
[im 88/88]
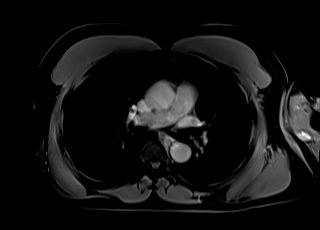

[Series 102: sub_20 sec · axial · 3.0mm · 1.25mm/px · z∈[-156,+105]mm · 3 of 88 slices shown]
[im 1/88]
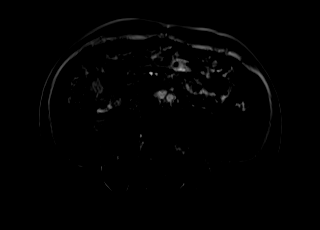
[im 44/88]
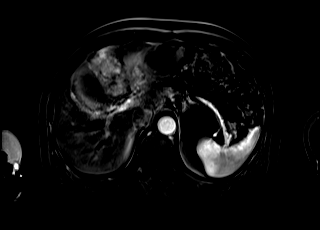
[im 88/88]
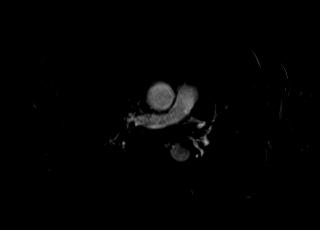

[Series 103: sub_45 sec · axial · 3.0mm · 1.25mm/px · z∈[-156,+105]mm · 3 of 88 slices shown]
[im 1/88]
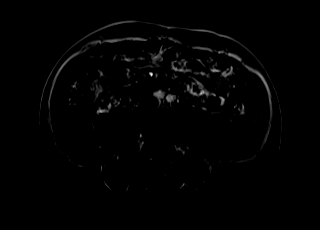
[im 44/88]
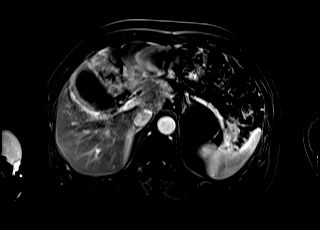
[im 88/88]
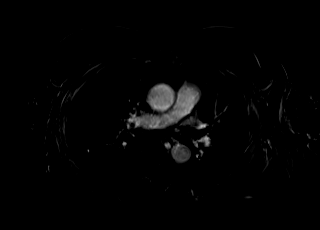

[Series 104: sub_90 sec · axial · 3.0mm · 1.25mm/px · z∈[-156,+105]mm · 3 of 88 slices shown]
[im 1/88]
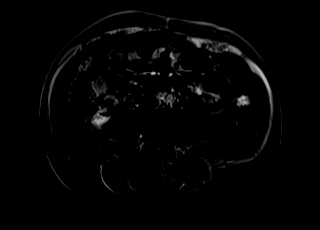
[im 44/88]
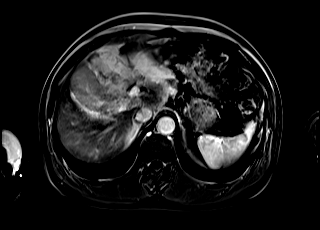
[im 88/88]
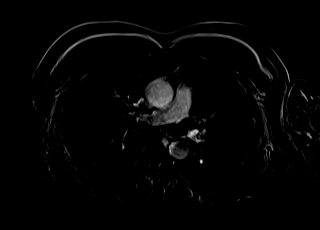

[Series 105: sub_delay · axial · 3.0mm · 1.25mm/px · 1 of 33 slices shown]
[im 1/33]
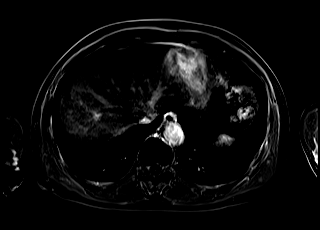

[44 of 48 positions shown; findings below may reference images not displayed]

FINDINGS: Lower chest: Lung bases are clear.

Hepatobiliary: Liver is within normal limits. No
suspicious/enhancing hepatic lesions. No hepatic steatosis.

Gallbladder is unremarkable. No intrahepatic or extrahepatic ductal
dilatation.

Pancreas: 4 x 6 mm unilocular cyst in the pancreatic head (series
4/image 22), without enhancement following contrast administration,
favoring a benign pseudocyst. No pancreatic atrophy or ductal
dilatation.

Spleen:  Within normal limits.

Adrenals/Urinary Tract:  Adrenal glands are within normal limits.

10 mm lesion in the posterior left lower kidney with intrinsic T1
hyperintensity (series 13/image 71), but without enhancement
following contrast administration, suggesting a benign hemorrhagic
cyst (Bosniak II). Additional simple bilateral renal cysts measuring
up to 7.9 cm in the posterior left upper kidney (series 4/image 24),
benign (Bosniak I). No enhancing renal lesions. No hydronephrosis.

Stomach/Bowel: Stomach and visualized bowel are unremarkable.

Vascular/Lymphatic:  No evidence of abdominal aortic aneurysm.

No suspicious abdominal lymphadenopathy.

Other:  No abdominal ascites.

Musculoskeletal: No focal osseous lesions.
IMPRESSION: 6 mm unilocular cyst in the pancreatic head, favoring a benign
pseudocyst.

10 mm hemorrhagic cyst in the posterior left lower kidney, benign
(Bosniak II). Additional simple bilateral renal cysts measuring up
to 7.9 cm in the posterior left upper kidney, benign (Bosniak I). No
enhancing renal lesions.

## 2020-01-06 MED ORDER — GADOBUTROL 1 MMOL/ML IV SOLN
7.0000 mL | Freq: Once | INTRAVENOUS | Status: AC | PRN
  Administered 2020-01-06: 7 mL via INTRAVENOUS

## 2020-03-13 ENCOUNTER — Ambulatory Visit (HOSPITAL_COMMUNITY)
Admission: EM | Admit: 2020-03-13 | Discharge: 2020-03-13 | Disposition: A | Discharge status: Home / Self Care | Payer: BC Managed Care – PPO | Attending: Student | Admitting: Student

## 2020-03-13 ENCOUNTER — Encounter (HOSPITAL_COMMUNITY): Payer: Self-pay

## 2020-03-13 DIAGNOSIS — R350 Frequency of micturition: Secondary | ICD-10-CM | POA: Diagnosis not present

## 2020-03-13 DIAGNOSIS — R6883 Chills (without fever): Secondary | ICD-10-CM

## 2020-03-13 DIAGNOSIS — N3001 Acute cystitis with hematuria: Secondary | ICD-10-CM | POA: Diagnosis not present

## 2020-03-13 DIAGNOSIS — R300 Dysuria: Secondary | ICD-10-CM | POA: Diagnosis not present

## 2020-03-13 DIAGNOSIS — N189 Chronic kidney disease, unspecified: Secondary | ICD-10-CM

## 2020-03-13 LAB — POCT URINALYSIS DIPSTICK, ED / UC
Bilirubin Urine: NEGATIVE
Glucose, UA: NEGATIVE mg/dL
Ketones, ur: NEGATIVE mg/dL
Nitrite: POSITIVE — AB
Protein, ur: 100 mg/dL — AB
Specific Gravity, Urine: 1.02 (ref 1.005–1.030)
Urobilinogen, UA: 1 mg/dL (ref 0.0–1.0)
pH: 5.5 (ref 5.0–8.0)

## 2020-03-13 MED ORDER — NITROFURANTOIN MONOHYD MACRO 100 MG PO CAPS: 100.0000 mg | ORAL_CAPSULE | Freq: Two times a day (BID) | ORAL | 0 refills | Status: DC

## 2020-03-13 NOTE — Discharge Instructions (Signed)
-  start the antibiotic- Macrobid twice daily x5 days -make sure to drink plenty of water! -continue to follow-up with PCP and urology as scheduled.

## 2020-03-13 NOTE — ED Triage Notes (Signed)
Pt reports increase urinary frequency, burnings when urinating and chills x 2 days.

## 2020-03-13 NOTE — ED Provider Notes (Signed)
[facility]    CSN: [id number] Arrival date & time: 03/13/20  1631      History   Chief Complaint Chief Complaint  Patient presents with  . Urinary Frequency  . Dysuria  . Chills    HPI Jeffery Strickland is a 65 y.o. male presenting with urinary frequency, dysuria, chills.  History of CKD, hyperlipidemia, hypertension, OSA. pt reports increase urinary frequency, burning when urinating and chills x 2 days.   History of CKD that he says is well controlled and stable. Denies hematuria, urgency, back pain, n/v/d/abd pain, abdnormal discharge.    HPI  Past Medical History:  Diagnosis Date  . CKD (chronic kidney disease)   . History of echocardiogram    Echo 3/11:  Normal LV function, mild basal septal hypertrophy, mild diastolic dysfunction, no significant valvular abnormalities // Echo 2/17: EF 60-65%, normal wall motion, grade 1 diastolic dysfunction, MAC  . History of nuclear stress test    Myoview 7/16:  EF 64%, normal perfusion, low risk  . Hyperlipemia   . Hypertension   . Hypertensive heart disease   . OSA (obstructive sleep apnea) 05/09/2015    Patient Active Problem List   Diagnosis Date Noted  . OSA (obstructive sleep apnea) 05/09/2015  . Chest discomfort 07/03/2014  . HTN (hypertension) 08/11/2012  . Hyperlipidemia 08/11/2012  . Hypertensive heart disease without CHF 08/11/2012  . Other ejaculatory dysfunction 08/11/2012    Past Surgical History:  Procedure Laterality Date  . NM MYOCAR PERF WALL MOTION  01/11/2010   normal  . NOCTURNAL POLYSOMNOGRAPHY WITH SEIZURE MONTAGE  05/02/2015  . SPLIT NIGHT STUDY  05/02/2015  . 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 Authorizing Provider  nitrofurantoin, macrocrystal-monohydrate, (MACROBID) 100 MG capsule Take 1  capsule (100 mg total) by mouth 2 (two) times daily. 03/13/20  Yes Hazel Sams, PA-C  acetaminophen-codeine (TYLENOL #4) 300-60 MG tablet  11/08/17   [provider]  aspirin 81 MG tablet Take 81 mg by mouth daily.    [provider]  BYSTOLIC 2.5 MG tablet TAKE 1 TABLET BY MOUTH DAILY. 09/13/15   Croitoru, Mihai, MD  meclizine (ANTIVERT) 25 MG tablet Take 1 tablet (25 mg total) by mouth 3 (three) times daily as needed for dizziness. 11/04/18   Sherwood Gambler, MD  potassium chloride SA (K-DUR,KLOR-CON) 20 MEQ tablet  12/17/17   [provider]  rosuvastatin (CRESTOR) 10 MG tablet Take 10 mg by mouth daily.    [provider]  spironolactone (ALDACTONE) 25 MG tablet Take 25 mg by mouth daily.  02/06/15   [provider]  TRIBENZOR 40-10-12.5 MG TABS Take 1 tablet by mouth daily. Take 1 tab daily 05/22/14   [provider]    Family History Family History  Problem Relation Age of Onset  . Heart attack Father   . Cancer Sister   . Diabetes Sister     Social History Social History   Tobacco Use  . Smoking status: Never Smoker  . Smokeless tobacco: Never Used  Vaping Use  . Vaping Use: Never used  Substance Use Topics  . Alcohol use: No    Alcohol/week: 0.0 standard drinks  . Drug use: No     Allergies   Patient has no known allergies.   Review of Systems  Review of Systems  Constitutional: Positive for chills. Negative for appetite change, diaphoresis and fever.  Respiratory: Negative for shortness of breath.   Cardiovascular: Negative for chest pain.  Gastrointestinal: Negative for abdominal pain, blood in stool, constipation, diarrhea, nausea and vomiting.  Genitourinary: Positive for dysuria and frequency. Negative for decreased urine volume, difficulty urinating, flank pain, genital sores, hematuria and urgency.  Musculoskeletal: Negative for back pain.  Neurological: Negative for dizziness, weakness and  light-headedness.  All other systems reviewed and are negative.    Physical Exam Triage Vital Signs ED Triage Vitals  Enc Vitals Group     BP 03/13/20 1644 118/75     Pulse Rate 03/13/20 1644 71     Resp 03/13/20 1644 18     Temp 03/13/20 1644 98.8 F (37.1 C)     Temp Source 03/13/20 1644 Oral     SpO2 03/13/20 1644 97 %     Weight --      Height --      Head Circumference --      Peak Flow --      Pain Score 03/13/20 1643 0     Pain Loc --      Pain Edu? --      Excl. in [place]? --    No data found.  Updated Vital Signs BP 118/75 (BP Location: Left Arm)   Pulse 71   Temp 98.8 F (37.1 C) (Oral)   Resp 18   SpO2 97%   Visual Acuity Right Eye Distance:   Left Eye Distance:   Bilateral Distance:    Right Eye Near:   Left Eye Near:    Bilateral Near:     Physical Exam Vitals reviewed.  Constitutional:      General: He is not in acute distress.    Appearance: Normal appearance. He is not ill-appearing.  HENT:     Head: Normocephalic and atraumatic.  Cardiovascular:     Rate and Rhythm: Normal rate and regular rhythm.     Heart sounds: Normal heart sounds.  Pulmonary:     Effort: Pulmonary effort is normal.     Breath sounds: Normal breath sounds. No wheezing, rhonchi or rales.  Abdominal:     General: Bowel sounds are normal. There is no distension.     Palpations: Abdomen is soft. There is no mass.     Tenderness: There is no abdominal tenderness. There is no right CVA tenderness, left CVA tenderness, guarding or rebound.  Neurological:     General: No focal deficit present.     Mental Status: He is alert and oriented to person, place, and time.  Psychiatric:        Mood and Affect: Mood normal.        Behavior: Behavior normal.      UC Treatments / Results  Labs (all labs ordered are listed, but only abnormal results are displayed) Labs Reviewed  POCT URINALYSIS DIPSTICK, ED / UC - Abnormal; Notable for the following components:      Result Value    Hgb urine dipstick LARGE (*)    Protein, ur 100 (*)    Nitrite POSITIVE (*)    Leukocytes,Ua SMALL (*)    All other components within normal limits  URINE CULTURE    EKG   Radiology No results found.  Procedures Procedures (including critical care time)  Medications Ordered in UC Medications - No data to display  Initial Impression / Assessment and Plan / UC Course  I have reviewed the triage vital signs and the nursing notes.  Pertinent labs & imaging results that were available during my care of the patient were reviewed by me and considered in my medical decision making (see chart for details).      This patient is a 65 year old male presenting with UTI symptoms x2 days. History CKD. Followed closely by urology and PCP; states kidney function is stable. Today he is  afebrile nontachycardic nontachypneic, oxygenating well on room air.   Today UA is with large blood, positive nitrite, small leuk. Culture sent. Plan to treat for UTI with macrobid as below.  Continue to follow with urology for CKD.  Spent over 40 minutes obtaining H&P, performing physical, discussing results, treatment plan and plan for follow-up with patient. Patient agrees with plan.     Final Clinical Impressions(s) / UC Diagnoses   Final diagnoses:  Acute cystitis with hematuria  Chronic kidney disease, unspecified CKD stage     Discharge Instructions     -start the antibiotic- Macrobid twice daily x5 days -make sure to drink plenty of water! -continue to follow-up with PCP and urology as scheduled.    ED Prescriptions    Medication Sig Dispense Auth. Provider   nitrofurantoin, macrocrystal-monohydrate, (MACROBID) 100 MG capsule Take 1 capsule (100 mg total) by mouth 2 (two) times daily. 10 capsule Hazel Sams, PA-C     PDMP not reviewed this encounter.   Hazel Sams, PA-C 03/13/20 1849

## 2020-03-14 ENCOUNTER — Telehealth (HOSPITAL_COMMUNITY): Payer: Self-pay | Admitting: Student

## 2020-03-14 ENCOUNTER — Ambulatory Visit (HOSPITAL_COMMUNITY): Payer: Self-pay

## 2020-03-14 LAB — URINE CULTURE: Culture: >=100000 — AB

## 2020-03-14 MED ORDER — NITROFURANTOIN MONOHYD MACRO 100 MG PO CAPS: 100.0000 mg | ORAL_CAPSULE | Freq: Two times a day (BID) | ORAL | 0 refills | Status: DC

## 2020-03-14 NOTE — Telephone Encounter (Signed)
Patient requesting prescription be sent to different pharmacy. Sent.

## 2020-03-15 LAB — URINE CULTURE

## 2020-05-21 ENCOUNTER — Other Ambulatory Visit: Payer: Self-pay

## 2020-05-21 ENCOUNTER — Ambulatory Visit (INDEPENDENT_AMBULATORY_CARE_PROVIDER_SITE_OTHER): Payer: BC Managed Care – PPO | Admitting: Cardiovascular Disease

## 2020-05-21 ENCOUNTER — Encounter: Payer: Self-pay | Admitting: Cardiovascular Disease

## 2020-05-21 ENCOUNTER — Ambulatory Visit: Payer: BC Managed Care – PPO | Admitting: Cardiovascular Disease

## 2020-05-21 VITALS — BP 134/84 | HR 59 | Ht 67.0 in | Wt 176.2 lb

## 2020-05-21 DIAGNOSIS — G4733 Obstructive sleep apnea (adult) (pediatric): Secondary | ICD-10-CM

## 2020-05-21 DIAGNOSIS — I10 Essential (primary) hypertension: Secondary | ICD-10-CM

## 2020-05-21 DIAGNOSIS — E7800 Pure hypercholesterolemia, unspecified: Secondary | ICD-10-CM | POA: Diagnosis not present

## 2020-05-21 NOTE — Patient Instructions (Signed)

## 2020-05-21 NOTE — Progress Notes (Signed)
Patient ID: Jeffery Strickland, male   DOB: May 01, 1955, 65 y.o.   MRN: [id number]    Cardiology Office Note    Date:  05/27/2020   ID:  Lancer Thurner, DOB Aug 08, 1955, MRN [id number]  PCP:  Lucianne Lei, MD  Cardiologist:   Sanda Klein, MD   Chief Complaint  Patient presents with  . HTN        History of Present Illness:  Jeffery Strickland is a 65 y.o. male with HTN-related LVH and hyperlipidemia, OSA.  Mr. 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  He continues to ride his bicycle and goes to the gym several times a week, although the duration of his exercise overall has decreased since he started working again.  He has no cardiovascular complaints.  The patient specifically denies any chest pain at rest exertion, dyspnea at rest or with exertion, orthopnea, paroxysmal nocturnal dyspnea, syncope, palpitations, focal neurological deficits, intermittent claudication, lower extremity edema, unexplained weight gain, cough, hemoptysis or wheezing.  He does complain of somewhat low energy level, wonders whether he is just getting older.  He denies any problems with daytime hypersomnolence.  Checks his blood pressure occasionally at home and on the average it is running around 120/65.  His blood pressure is consistently well controlled but he requires 5 different medications in high doses (maximum dose ARB, calcium channel blocker as well as lower doses of beta-blocker, aldosterone antagonist and).  His EKG shows prominent changes of LVH.   Past Medical History:  Diagnosis Date  . CKD (chronic kidney disease)   . History of echocardiogram    Echo 3/11:  Normal LV function, mild basal septal hypertrophy, mild diastolic dysfunction, no significant valvular abnormalities // Echo 2/17: EF 60-65%, normal wall motion, grade 1 diastolic dysfunction, MAC  . History of nuclear stress  test    Myoview 7/16:  EF 64%, normal perfusion, low risk  . Hyperlipemia   . Hypertension   . Hypertensive heart disease   . OSA (obstructive sleep apnea) 05/09/2015    Past Surgical History:  Procedure Laterality Date  . NM MYOCAR PERF WALL MOTION  01/11/2010   normal  . NOCTURNAL POLYSOMNOGRAPHY WITH SEIZURE MONTAGE  05/02/2015  . SPLIT NIGHT STUDY  05/02/2015  . US ECHOCARDIOGRAPHY  03/22/2009   mild basal septal LV hypertropher,mild diastolic dysfx,consider early hypertrophic cardiomyopathy    Current Medications: Outpatient Medications Prior to Visit  Medication Sig Dispense Refill  . BYSTOLIC 2.5 MG tablet TAKE 1 TABLET BY MOUTH DAILY. 30 tablet 6  . meclizine (ANTIVERT) 25 MG tablet Take 1 tablet (25 mg total) by mouth 3 (three) times daily as needed for dizziness. 10 tablet 0  . nitrofurantoin, macrocrystal-monohydrate, (MACROBID) 100 MG capsule Take 1 capsule (100 mg total) by mouth 2 (two) times daily. 10 capsule 0  . potassium chloride SA (K-DUR,KLOR-CON) 20 MEQ tablet   4  . rosuvastatin (CRESTOR) 10 MG tablet Take 10 mg by mouth daily.    Marland Kitchen spironolactone (ALDACTONE) 25 MG tablet Take 25 mg by mouth daily.   7  . TRIBENZOR 40-10-12.5 MG TABS Take 1 tablet by mouth daily. Take 1 tab daily    . aspirin 81 MG tablet Take 81 mg by mouth daily.    Marland Kitchen acetaminophen-codeine (TYLENOL #4) 300-60 MG tablet   0   No facility-administered medications prior to visit.  Allergies:   Patient has no known allergies.   Social History   Socioeconomic History  . Marital status: Married    Spouse name: Not on file  . Number of children: Not on file  . Years of education: Not on file  . Highest education level: Not on file  Occupational History  . Occupation: Chief Operating Officer: [employer]    Comment: [employer]  Tobacco Use  . Smoking status: Never Smoker  . Smokeless tobacco: Never Used  Vaping Use  . Vaping Use: Never used  Substance and Sexual  Activity  . Alcohol use: No    Alcohol/week: 0.0 standard drinks  . Drug use: No  . Sexual activity: Not on file  Other Topics Concern  . Not on file  Social History Narrative  . Not on file   Social Determinants of Health   Financial Resource Strain: Not on file  Food Insecurity: Not on file  Transportation Needs: Not on file  Physical Activity: Not on file  Stress: Not on file  Social Connections: Not on file     Family History:  The patient's family history includes Cancer in his sister; Diabetes in his sister; Heart attack in his father.  His mother had multiple medical problems including congestive heart failure and died at age 49 just recently.  ROS:   Please see the history of present illness.    ROS All other systems are reviewed and are negative.   PHYSICAL EXAM:   VS:  BP 134/84 (BP Location: Left Arm, Patient Position: Sitting, Cuff Size: Normal)   Pulse (!) 59   Ht _0  (1.702 m)   Wt 176 lb 3.2 oz (79.9 kg)   BMI 27.60 kg/m     General: Alert, oriented x3, no distress, mildly overweight but appears fit Head: no evidence of trauma, PERRL, EOMI, no exophtalmos or lid lag, no myxedema, no xanthelasma; normal ears, nose and oropharynx Neck: normal jugular venous pulsations and no hepatojugular reflux; brisk carotid pulses without delay and no carotid bruits Chest: clear to auscultation, no signs of consolidation by percussion or palpation, normal fremitus, symmetrical and full respiratory excursions Cardiovascular: normal position and quality of the apical impulse, regular rhythm, normal first and second heart sounds, no murmurs, rubs or gallops Abdomen: no tenderness or distention, no masses by palpation, no abnormal pulsatility or arterial bruits, normal bowel sounds, no hepatosplenomegaly Extremities: no clubbing, cyanosis or edema; 2+ radial, ulnar and brachial pulses bilaterally; 2+ right femoral, posterior tibial and dorsalis pedis pulses; 2+ left femoral,  posterior tibial and dorsalis pedis pulses; no subclavian or femoral bruits Neurological: grossly nonfocal Psych: Normal mood and affect    Wt Readings from Last 3 Encounters:  05/21/20 176 lb 3.2 oz (79.9 kg)  05/13/19 167 lb 9.6 oz (76 kg)  01/03/19 166 lb (75.3 kg)      Studies/Labs Reviewed:   EKG:  EKG is ordered today.  It similar to previous tracings and shows normal sinus rhythm with left ventricular hypertrophy and secondary repolarization abnormalities, QTC 366 ms Labs: 10/08/2018 Total cholesterol 148, HDL 59, LDL 69, triglycerides 121 Hemoglobin A1c 5.8% Hemoglobin 15.8 Creatinine 1.55, potassium 3.8, normal liver function tests  03/03/2020 Creatinine 1.45, hemoglobin A1c 5.8%, hemoglobin 15.1, potassium 4.3, normal liver function tests   BMET    Component Value Date/Time   NA 137 11/04/2018 0824   K 3.8 11/04/2018 0824   CL 102 11/04/2018 0824   CO2 25 11/04/2018  3354   GLUCOSE 117 (H) 11/04/2018 0824   BUN 23 11/04/2018 0824   CREATININE 1.55 (H) 11/04/2018 0824   CREATININE 1.55 (H) 11/22/2013 1650   CALCIUM 9.8 11/04/2018 0824   GFRNONAA 47 (L) 11/04/2018 0824   GFRAA 54 (L) 11/04/2018 0824   Lipid Panel  No results found for: CHOL, TRIG, HDL, CHOLHDL, VLDL, LDLCALC, LDLDIRECT, LABVLDL 03/03/2020 Cholesterol 131, HDL 46, LDL 69, triglycerides 77  ASSESSMENT:    1. Essential hypertension   2. OSA (obstructive sleep apnea)   3. Pure hypercholesterolemia      PLAN:  In order of problems listed above:  1. HTN with secondary LVH: Blood pressure control is good, but he requires multiple medications.  Stable renal function. 2. OSA: I wonder whether the lack of energy that he reports might be a recurrence of obstructive sleep apnea which seems to be "cured" when he lost a lot of weight.  Asked him to keep an open mind about restarting treatment with CPAP and avoid gaining weight. 3. HLP: All lipid parameters are good..  Again note the borderline  hemoglobin A1c at 5.8%, suggesting some degree of insulin resistance.  Continue exercising and avoid weight gain.  Avoid sugars and starchy foods  Medication Adjustments/Labs and Tests Ordered: Current medicines are reviewed at length with the patient today.  Concerns regarding medicines are outlined above.  Medication changes, Labs and Tests ordered today are listed in the Patient Instructions below. Patient Instructions  Medication Instructions:  No changes *If you need a refill on your cardiac medications before your next appointment, please call your pharmacy*   Lab Work: None ordered If you have labs (blood work) drawn today and your tests are completely normal, you will receive your results only by: Marland Kitchen MyChart Message (if you have MyChart) OR . A paper copy in the mail If you have any lab test that is abnormal or we need to change your treatment, we will call you to review the results.   Testing/Procedures: None ordered   Follow-Up: At [facility], you and your health needs are our priority.  As part of our continuing mission to provide you with exceptional heart care, we have created designated Provider Care Teams.  These Care Teams include your primary Cardiologist (physician) and Advanced Practice Providers (APPs -  Physician Assistants and Nurse Practitioners) who all work together to provide you with the care you need, when you need it.  We recommend signing up for the patient portal called "MyChart".  Sign up information is provided on this After Visit Summary.  MyChart is used to connect with patients for Virtual Visits (Telemedicine).  Patients are able to view lab/test results, encounter notes, upcoming appointments, etc.  Non-urgent messages can be sent to your provider as well.   To learn more about what you can do with MyChart, go to [email or web address].    Your next appointment:   12 month(s)  The format for your next appointment:   In Person  Provider:    You may see Sanda Klein, MD or one of the following Advanced Practice Providers on your designated Care Team:    Almyra Deforest, PA-C  Fabian Sharp, [place] or   Roby Lofts, PA-C      Signed, Sanda Klein, MD  05/27/2020 4:31 PM    [address] [place], [place], [place]  [address] Phone: [phone]; Fax: [phone]

## 2020-06-16 ENCOUNTER — Other Ambulatory Visit: Payer: Self-pay

## 2020-06-16 ENCOUNTER — Telehealth: Payer: Self-pay | Admitting: *Deleted

## 2020-06-16 ENCOUNTER — Ambulatory Visit: Payer: BC Managed Care – PPO | Admitting: Medical

## 2020-06-16 VITALS — BP 104/74 | HR 57 | Ht 67.0 in | Wt 170.8 lb

## 2020-06-16 DIAGNOSIS — G4733 Obstructive sleep apnea (adult) (pediatric): Secondary | ICD-10-CM | POA: Diagnosis not present

## 2020-06-16 DIAGNOSIS — I10 Essential (primary) hypertension: Secondary | ICD-10-CM

## 2020-06-16 DIAGNOSIS — R072 Precordial pain: Secondary | ICD-10-CM

## 2020-06-16 DIAGNOSIS — E785 Hyperlipidemia, unspecified: Secondary | ICD-10-CM

## 2020-06-16 DIAGNOSIS — D631 Anemia in chronic kidney disease: Secondary | ICD-10-CM

## 2020-06-16 DIAGNOSIS — N1831 Chronic kidney disease, stage 3a: Secondary | ICD-10-CM

## 2020-06-16 NOTE — Patient Instructions (Addendum)
Medication Instructions:  Continue current medications  *If you need a refill on your cardiac medications before your next appointment, please call your pharmacy*   Lab Work: [facility] for CTA  If you have labs (blood work) drawn today and your tests are completely normal, you will receive your results only by: Marland Kitchen MyChart Message (if you have MyChart) OR . A paper copy in the mail If you have any lab test that is abnormal or we need to change your treatment, we will call you to review the results.   Testing/Procedures: Non-Cardiac CT Angiography (CTA), is a special type of CT scan that uses a computer to produce multi-dimensional views of major blood vessels throughout the body. In CT angiography, a contrast material is injected through an IV to help visualize the blood vessels    Follow-Up: At [facility], you and your health needs are our priority.  As part of our continuing mission to provide you with exceptional heart care, we have created designated Provider Care Teams.  These Care Teams include your primary Cardiologist (physician) and Advanced Practice Providers (APPs -  Physician Assistants and Nurse Practitioners) who all work together to provide you with the care you need, when you need it.  We recommend signing up for the patient portal called "MyChart".  Sign up information is provided on this After Visit Summary.  MyChart is used to connect with patients for Virtual Visits (Telemedicine).  Patients are able to view lab/test results, encounter notes, upcoming appointments, etc.  Non-urgent messages can be sent to your provider as well.   To learn more about what you can do with MyChart, go to [email or web address].    Your next appointment:   3 month(s)  The format for your next appointment:   In Person  Provider:   You will see one of the following Advanced Practice Providers on your designated Care Team:     Judy Pimple, [place]    OTHER:  Your cardiac CT will be  scheduled at one of the below locations:   [facility] [address] [place], [place] [address] [phone]  OR  [facility] [address] [place], [place] [address] [phone]  If scheduled at [facility], please arrive at the [facility] main entrance (entrance A) of [facility] 30 minutes prior to test start time. Proceed to the [facility] Radiology Department (first floor) to check-in and test prep.  If scheduled at [facility], please arrive 15 mins early for check-in and test prep.  Please follow these instructions carefully (unless otherwise directed):  Hold all erectile dysfunction medications at least 3 days (72 hrs) prior to test.  On the Night Before the Test: . Be sure to Drink plenty of water. . Do not consume any caffeinated/decaffeinated beverages or chocolate 12 hours prior to your test. . Do not take any antihistamines 12 hours prior to your test.   On the Day of the Test: . Drink plenty of water until 1 hour prior to the test. . Do not eat any food 4 hours prior to the test. . You may take your regular medications prior to the test.  . Take metoprolol (Lopressor) two hours prior to test. . HOLD Furosemide/Hydrochlorothiazide morning of the test. . FEMALES- please wear underwire-free bra if available       After the Test: . Drink plenty of water. . After receiving IV contrast, you may experience a mild flushed  feeling. This is normal. . On occasion, you may experience a mild rash up to 24 hours after the test. This is not dangerous. If this occurs, you can take Benadryl 25 mg and increase your fluid intake. . If you experience trouble breathing, this can be serious. If it is severe call 911 IMMEDIATELY. If it is mild, please call our office. . If you take any of these medications: Glipizide/Metformin, Avandament, Glucavance, please do not take 48 hours  after completing test unless otherwise instructed.   Once we have confirmed authorization from your insurance company, we will call you to set up a date and time for your test. Based on how quickly your insurance processes prior authorizations requests, please allow up to 4 weeks to be contacted for scheduling your Cardiac CT appointment. Be advised that routine Cardiac CT appointments could be scheduled as many as 8 weeks after your provider has ordered it.  For non-scheduling related questions, please contact the cardiac imaging nurse navigator should you have any questions/concerns: Rockwell Alexandria, Cardiac Imaging Nurse Navigator Larey Brick, Cardiac Imaging Nurse Navigator [facility] Direct Office Dial: [phone]   For scheduling needs, including cancellations and rescheduling, please call [place], [phone].

## 2020-06-16 NOTE — Telephone Encounter (Signed)
Patient called in with concerns about chest pain. He stated that when he was working out he felt a chest pain that did not radiate and lasted around 5 seconds. He denies chest pain currently.  The patient was already set up for an appointment today.

## 2020-06-16 NOTE — Progress Notes (Signed)
Cardiology Office Note   Date:  06/23/2020   ID:  Quanah Majka, DOB 18-Sep-1955, MRN [id number]  PCP:  Lucianne Lei, MD  Cardiologist:  Sanda Klein, MD EP: None  Chief Complaint  Patient presents with  . Chest Pain      History of Present Illness: Deland Slocumb is a 65 y.o. male with a PMH of HTN, HLD, and OSA, who presents for the evaluation of chest pain.  He was last evaluated by cardiology at an outpatient visit with Dr. Sallyanne Kuster 05/21/20, at which time he was doing well from a cardiac standpoint. He was exercising regularly without anginal complaints. No medication changes occurred and he was recommended to follow-up in 1 year. His last echocardiogram in 2017 showed EF 60-65%, G1DD, no RWMA, and no significant valvular abnormalities. His last ischemic evaluation was a NST in 2016 which was without ischemia.   He contacted our office with concerns for chest pain. For the past week he reports feeling increased lethargy/fatigue. He denied any associated SOB, dizziness, lightheadedness, syncope, palpitations, diaphoresis, nausea, vomiting, diarrhea, cough, congestion, or fever. This has not limited his activity significantly but was concerning to him. This morning he states he exercised per usual without any issues, however later in the morning he had a 5 second episode of sharp left sided chest pain which was new for him. He was very alarmed by this as he has not had chest pain in the past which prompted him to arrange this visit. He does report family history of heart attacks in his father and sister. He has not had any recent ischemic testing. His chest pain is atypical but worrisome to him.    Past Medical History:  Diagnosis Date  . CKD (chronic kidney disease)   . History of echocardiogram    Echo 3/11:  Normal LV function, mild basal septal hypertrophy, mild diastolic dysfunction, no significant valvular abnormalities // Echo 2/17: EF 60-65%, normal wall motion, grade 1  diastolic dysfunction, MAC  . History of nuclear stress test    Myoview 7/16:  EF 64%, normal perfusion, low risk  . Hyperlipemia   . Hypertension   . Hypertensive heart disease   . OSA (obstructive sleep apnea) 05/09/2015    Past Surgical History:  Procedure Laterality Date  . NM MYOCAR PERF WALL MOTION  01/11/2010   normal  . NOCTURNAL POLYSOMNOGRAPHY WITH SEIZURE MONTAGE  05/02/2015  . SPLIT NIGHT STUDY  05/02/2015  . US ECHOCARDIOGRAPHY  03/22/2009   mild basal septal LV hypertropher,mild diastolic dysfx,consider early hypertrophic cardiomyopathy     Current Outpatient Medications  Medication Sig Dispense Refill  . BYSTOLIC 2.5 MG tablet TAKE 1 TABLET BY MOUTH DAILY. 30 tablet 6  . potassium chloride SA (K-DUR,KLOR-CON) 20 MEQ tablet   4  . rosuvastatin (CRESTOR) 10 MG tablet Take 10 mg by mouth daily.    Marland Kitchen spironolactone (ALDACTONE) 25 MG tablet Take 25 mg by mouth daily.   7  . TRIBENZOR 40-10-12.5 MG TABS Take 1 tablet by mouth daily. Take 1 tab daily     No current facility-administered medications for this visit.    Allergies:   Patient has no known allergies.    Social History:  The patient  reports that he has never smoked. He has never used smokeless tobacco. He reports that he does not drink alcohol and does not use drugs.   Family History:  The patient's family history includes Cancer in his sister; Diabetes in his sister; Heart attack  in his father.    ROS:  Please see the history of present illness.   Otherwise, review of systems are positive for none.   All other systems are reviewed and negative.    PHYSICAL EXAM: VS:  BP 104/74   Pulse (!) 57   Ht _0  (1.702 m)   Wt 170 lb 12.8 oz (77.5 kg)   BMI 26.75 kg/m  , BMI Body mass index is 26.75 kg/m. GEN: Well nourished, well developed, in no acute distress HEENT: sclera anicteric Neck: no JVD, carotid bruits, or masses Cardiac: RRR; no murmurs, rubs, or gallops, no edema  Respiratory:  clear to  auscultation bilaterally, normal work of breathing GI: soft, nontender, nondistended, + BS MS: no deformity or atrophy Skin: warm and dry, no rash Neuro:  Strength and sensation are intact Psych: euthymic mood, full affect   EKG:  EKG is ordered today. The ekg ordered today demonstrates sinus bradycardia with rate 57 bpm, LVH, non-specific T wave abnormalities in lateral leads, no significant change from previous.    Recent Labs: No results found for requested labs within last 8760 hours.    Lipid Panel No results found for: CHOL, TRIG, HDL, CHOLHDL, VLDL, LDLCALC, LDLDIRECT    Wt Readings from Last 3 Encounters:  06/16/20 170 lb 12.8 oz (77.5 kg)  05/21/20 176 lb 3.2 oz (79.9 kg)  05/13/19 167 lb 9.6 oz (76 kg)      Other studies Reviewed: Additional studies/ records that were reviewed today include:   Echocardiogram 2017: - Left ventricle: The cavity size was normal. Wall thickness was  normal. Systolic function was normal. The estimated ejection  fraction was in the range of 60% to 65%. Wall motion was normal;  there were no regional wall motion abnormalities. Doppler  parameters are consistent with abnormal left ventricular  relaxation (grade 1 diastolic dysfunction).  - Mitral valve: Calcified annulus.   Impressions:   - Normal LV systolc function; grade 1 diastolic dysfunction; trace  MR and TR.   NST 2016:  The left ventricular ejection fraction is normal (55-65%).  Nuclear stress EF: 64%.  There was no ST segment deviation noted during stress.  No T wave inversion was noted during stress.  The study is normal.  This is a low risk study.   Low risk stress nuclear study with normal perfusion and normal left ventricular regional and global systolic function.   ASSESSMENT AND PLAN:  1. Chest pain in patient with no known CAD: patient presents with brief chest pain this morning, precipitated by several days of increased lethargy/fatigue.  EKG today was non-ischemic. He is quite concerned about the episode given family history of MI in father and sister. He is interested in more definitive ischemic testing  - Will check a coronary CTA - okay to delay a month or two with current contrast shortage  2. HTN: BP well controlled at 104/74 today - Continue bystolic, spironolactone, and tribenzor (omesartan/amlodipine/hydrochlorothiazide)   3. HLD: LDL 69 02/2020; at goal - Continue crestor  4. OSA: compliant with CPAP - Continue CPAP  5. CKD stage 3a: Cr 1.4 02/2020 - Continue routine monitoring. Will get BMET prior to CTA. Will need good po hydration prior to his CT.    Current medicines are reviewed at length with the patient today.  The patient does not have concerns regarding medicines.  The following changes have been made:  As above  Labs/ tests ordered today include:   Orders Placed This Encounter  Procedures  . CT CORONARY MORPH W/CTA COR W/SCORE W/CA W/CM &/OR WO/CM  . CT CORONARY FRACTIONAL FLOW RESERVE DATA PREP  . CT CORONARY FRACTIONAL FLOW RESERVE FLUID ANALYSIS  . Basic Metabolic Panel (BMET)  . EKG 12-Lead     Disposition:   FU with me in 3 months  Signed, Abigail Butts, PA-C  06/23/2020 2:51 PM

## 2020-06-21 ENCOUNTER — Encounter: Payer: Self-pay | Admitting: Medical

## 2020-06-23 NOTE — Progress Notes (Signed)
Contrast shortage over, but there is probably a backlog. Thanks.

## 2020-07-02 ENCOUNTER — Telehealth (HOSPITAL_COMMUNITY): Payer: Self-pay | Admitting: Emergency Medicine

## 2020-07-02 LAB — BASIC METABOLIC PANEL
BUN/Creatinine Ratio: 10 (ref 10–24)
BUN: 16 mg/dL (ref 8–27)
CO2: 21 mmol/L (ref 20–29)
Calcium: 9.7 mg/dL (ref 8.6–10.2)
Chloride: 105 mmol/L (ref 96–106)
Creatinine, Ser: 1.54 mg/dL — ABNORMAL HIGH (ref 0.76–1.27)
Glucose: 100 mg/dL — ABNORMAL HIGH (ref 65–99)
Potassium: 5 mmol/L (ref 3.5–5.2)
Sodium: 143 mmol/L (ref 134–144)
eGFR: 50 mL/min/{1.73_m2} — ABNORMAL LOW (ref >59)

## 2020-07-02 NOTE — Telephone Encounter (Signed)
Reaching out to patient to offer assistance regarding upcoming cardiac imaging study; pt verbalizes understanding of appt date/time, parking situation and where to check in, pre-test NPO status and medications ordered, and verified current allergies; name and call back number provided for further questions should they arise Rockwell Alexandria RN Navigator Cardiac Imaging [facility] [phone] office [phone] cell  Daily meds per usual, holding spironolactone Pt reports he had his labs drawn today at [facility] 07/02/20 [place]

## 2020-07-05 ENCOUNTER — Other Ambulatory Visit: Payer: Self-pay

## 2020-07-05 DIAGNOSIS — R072 Precordial pain: Secondary | ICD-10-CM

## 2020-07-06 ENCOUNTER — Encounter (HOSPITAL_COMMUNITY): Payer: Self-pay

## 2020-07-06 ENCOUNTER — Ambulatory Visit (HOSPITAL_COMMUNITY)
Admission: RE | Admit: 2020-07-06 | Discharge: 2020-07-06 | Disposition: A | Discharge status: Home / Self Care | Payer: BC Managed Care – PPO | Source: Ambulatory Visit | Attending: Medical | Admitting: Medical

## 2020-07-06 ENCOUNTER — Other Ambulatory Visit: Payer: Self-pay

## 2020-07-06 DIAGNOSIS — R072 Precordial pain: Secondary | ICD-10-CM

## 2020-07-06 IMAGING — CT CT HEART MORP W/ CTA COR W/ SCORE W/ CA W/CM &/OR W/O CM
2 of 8 series · 4 of 20 positions shown, 5 images · IV contrast (APPLIED)
Comparison: None.
COMPARISON: None.

Addendum:
EXAM:
OVER-READ INTERPRETATION  CT CHEST

The following report is an over-read performed by radiologist Dr.
Arikawe Tsalha [REDACTED] on 07/06/2020. This
over-read does not include interpretation of cardiac or coronary
anatomy or pathology. The coronary calcium score/coronary CTA
interpretation by the cardiologist is attached.
HISTORY: Chest pain, nonspecific
Cardiac/Coronary CT
TECHNIQUE: The patient was scanned on a Siemens Force scanner.
PROTOCOL: A 100 kV prospective scan was triggered in the descending thoracic
aorta at 111 HU's. Axial non-contrast 3 mm slices were carried out
through the heart. The data set was analyzed on a dedicated work
station and scored using the Agatson method. Gantry rotation speed
was 250 msecs and collimation was 0.6 mm. Heart rate optimized
medically, and 0.8 mg of sublingual nitroglycerin was given. The 3D
data set was reconstructed in 5% intervals of 35-75% of the R-R
cycle. Diastolic phases were analyzed on a dedicated work station
using MPR, MIP and VRT modes. The patient received 160mL OMNIPAQUE
IOHEXOL 350 MG/ML SOLN of contrast.

[Series 6: best diast 72 % · axial · 0.37mm/px · z∈[-328,-284]mm · 2 of 328 slices shown, 3 images]
[im 110/328  vessel]
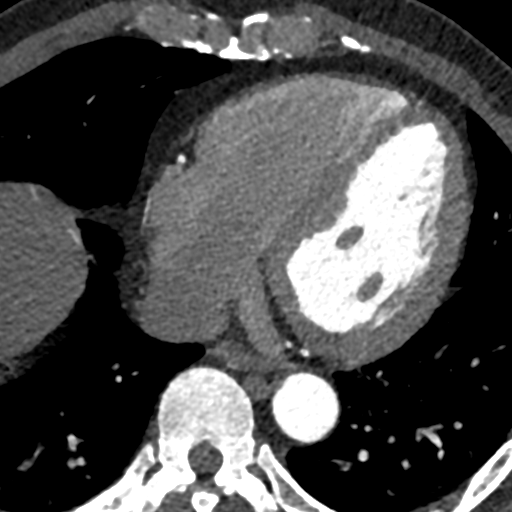
[im 110/328  lung]
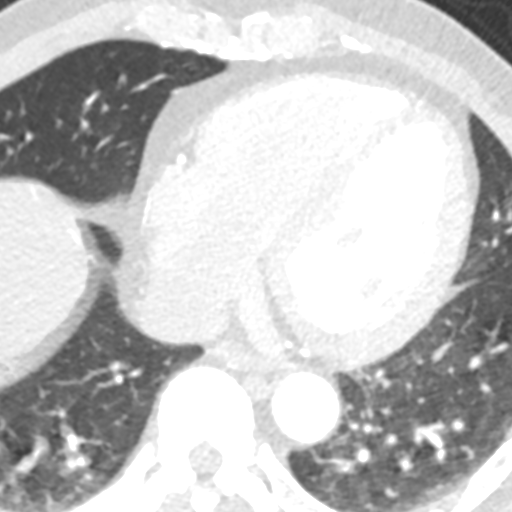
[im 219/328  vessel]
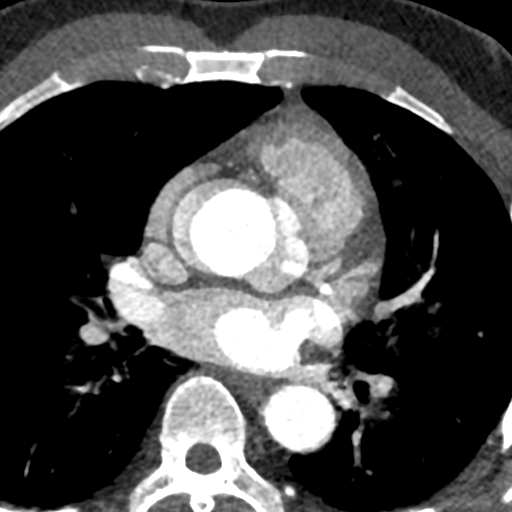

[Series 9: best diast 74 % · axial · 0.39mm/px · z∈[-337,-292]mm · 2 of 341 slices shown]
[im 114/341  vessel]
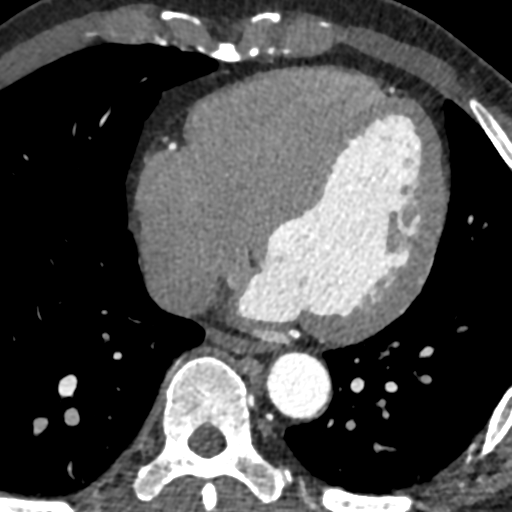
[im 227/341  vessel]
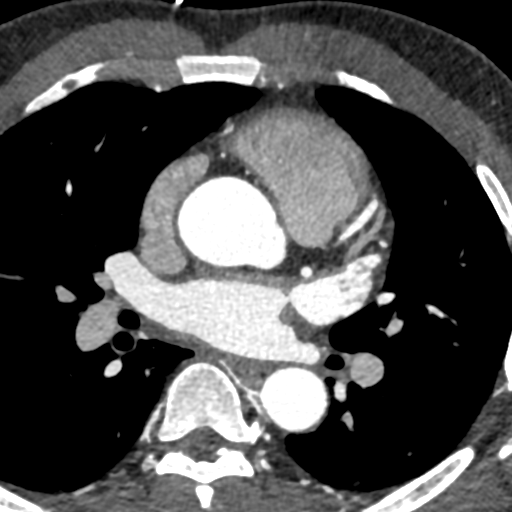

[4 of 20 positions shown; findings below may reference images not displayed]

FINDINGS: Atherosclerotic calcifications in the thoracic aorta. Small
calcified granuloma in the posterior aspect of the right lower lobe.
Within the visualized portions of the thorax there are no other
suspicious appearing pulmonary nodules or masses, there is no acute
consolidative airspace disease, no pleural effusions, no
pneumothorax and no lymphadenopathy. Visualized portions of the
upper abdomen demonstrates a large low-attenuation lesion which is
incompletely imaged in the superior aspect of the left
retroperitoneum corresponding to a large left renal cyst on prior
abdominal MRI 01/06/2020. There are no aggressive appearing lytic or
blastic lesions noted in the visualized portions of the skeleton.
IMPRESSION: 1.  Aortic Atherosclerosis (5UAYO-OAQ.Q).
FINDINGS: Coronary calcium score: The patient's coronary artery calcium score
is 0, which places the patient in the 0 percentile.

Coronary arteries: Normal coronary origins.  Right dominance.

Right Coronary Artery: Normal caliber vessel, gives rise to PDA. No
significant plaque or stenosis.

Left Main Coronary Artery: Normal caliber vessel. No significant
plaque or stenosis.

Left Anterior Descending Coronary Artery: Normal caliber vessel. No
significant plaque or stenosis. Gives rise to diagonal branches.

Left Circumflex Artery: Normal caliber vessel. No significant plaque
or stenosis. Gives rise to OM branches.

Aorta: Normal size, 35 mm at the mid ascending aorta (level of the
PA bifurcation) measured double oblique. Trivial calcifications
consistent with aortic atherosclerosis. No dissection.

Aortic Valve: No calcifications. Trileaflet.

Other findings:

Normal pulmonary vein drainage into the left atrium.

Normal left atrial appendage without a thrombus.

Normal size of the pulmonary artery.

Small PFO without significant shunting.
IMPRESSION: 1. No evidence of CAD, CADRADS = 0.

2. Coronary calcium score of 0. This was 0 percentile for age and
sex matched control.

3. Normal coronary origin with right dominance.

4.  Small PFO without significant shunting.

INTERPRETATION:

1. CAD-RADS 0: No evidence of CAD (0%). Consider non-atherosclerotic
causes of chest pain.

2. CAD-RADS 1: Minimal non-obstructive CAD (0-24%). Consider
non-atherosclerotic causes of chest pain. Consider preventive
therapy and risk factor modification.

3. CAD-RADS 2: Mild non-obstructive CAD (25-49%). Consider
non-atherosclerotic causes of chest pain. Consider preventive
therapy and risk factor modification.

4. CAD-RADS 3: Moderate stenosis (50-69%). Consider symptom-guided
anti-ischemic pharmacotherapy as well as risk factor modification
per guideline directed care. Additional analysis with CT FFR will be
submitted.

5. CAD-RADS 4: Severe stenosis. (70-99% or > 50% left main). Cardiac
catheterization or CT FFR is recommended. Consider symptom-guided
anti-ischemic pharmacotherapy as well as risk factor modification
per guideline directed care. Invasive coronary angiography
recommended with revascularization per published guideline
statements.

6. CAD-RADS 5: Total coronary occlusion (100%). Consider cardiac
catheterization or viability assessment. Consider symptom-guided
anti-ischemic pharmacotherapy as well as risk factor modification
per guideline directed care.

7. CAD-RADS N: Non-diagnostic study. Obstructive CAD can't be
excluded. Alternative evaluation is recommended.

*** End of Addendum ***
EXAM:
OVER-READ INTERPRETATION  CT CHEST

The following report is an over-read performed by radiologist Dr.
Arikawe Tsalha [REDACTED] on 07/06/2020. This
over-read does not include interpretation of cardiac or coronary
anatomy or pathology. The coronary calcium score/coronary CTA
interpretation by the cardiologist is attached.
FINDINGS: Atherosclerotic calcifications in the thoracic aorta. Small
calcified granuloma in the posterior aspect of the right lower lobe.
Within the visualized portions of the thorax there are no other
suspicious appearing pulmonary nodules or masses, there is no acute
consolidative airspace disease, no pleural effusions, no
pneumothorax and no lymphadenopathy. Visualized portions of the
upper abdomen demonstrates a large low-attenuation lesion which is
incompletely imaged in the superior aspect of the left
retroperitoneum corresponding to a large left renal cyst on prior
abdominal MRI 01/06/2020. There are no aggressive appearing lytic or
blastic lesions noted in the visualized portions of the skeleton.
IMPRESSION: 1.  Aortic Atherosclerosis (5UAYO-OAQ.Q).

## 2020-07-06 MED ORDER — IOHEXOL 350 MG/ML SOLN
80.0000 mL | Freq: Once | INTRAVENOUS | Status: AC | PRN
  Administered 2020-07-06: 160 mL via INTRAVENOUS

## 2020-07-06 MED ORDER — NITROGLYCERIN 0.4 MG SL SUBL
0.8000 mg | SUBLINGUAL_TABLET | Freq: Once | SUBLINGUAL | Status: AC
  Administered 2020-07-06: 0.8 mg via SUBLINGUAL

## 2020-07-06 MED ORDER — NITROGLYCERIN 0.4 MG SL SUBL
SUBLINGUAL_TABLET | SUBLINGUAL | Status: AC
  Filled 2020-07-06: qty 2

## 2020-07-09 ENCOUNTER — Other Ambulatory Visit: Payer: Self-pay

## 2020-07-09 DIAGNOSIS — R072 Precordial pain: Secondary | ICD-10-CM

## 2020-07-14 LAB — BASIC METABOLIC PANEL
BUN/Creatinine Ratio: 15 (ref 10–24)
BUN: 23 mg/dL (ref 8–27)
CO2: 19 mmol/L — ABNORMAL LOW (ref 20–29)
Calcium: 9.7 mg/dL (ref 8.6–10.2)
Chloride: 104 mmol/L (ref 96–106)
Creatinine, Ser: 1.5 mg/dL — ABNORMAL HIGH (ref 0.76–1.27)
Glucose: 82 mg/dL (ref 65–99)
Potassium: 4.3 mmol/L (ref 3.5–5.2)
Sodium: 138 mmol/L (ref 134–144)
eGFR: 52 mL/min/{1.73_m2} — ABNORMAL LOW (ref >59)

## 2021-06-22 NOTE — Progress Notes (Signed)
Cardiology Clinic Note   Patient Name: Jeffery Strickland Date of Encounter: 06/24/2021  Primary Care Provider:  Lucianne Lei, MD Primary Cardiologist:  Sanda Klein, MD  Patient Profile    66 year old male patient with history of hypertension, hyperlipidemia, OSA, and chronic chest pain.  Coronary CTA completed on 07/06/2020 revealed a calcium score of 0 with no indication of coronary artery disease, MI, grade 1 diastolic dysfunction per echo in 2022.   Past Medical History    Past Medical History:  Diagnosis Date   CKD (chronic kidney disease)    History of echocardiogram    Echo 3/11:  Normal LV function, mild basal septal hypertrophy, mild diastolic dysfunction, no significant valvular abnormalities // Echo 2/17: EF 60-65%, normal wall motion, grade 1 diastolic dysfunction, MAC   History of nuclear stress test    Myoview 7/16:  EF 64%, normal perfusion, low risk   Hyperlipemia    Hypertension    Hypertensive heart disease    OSA (obstructive sleep apnea) 05/09/2015   Past Surgical History:  Procedure Laterality Date   NM MYOCAR PERF WALL MOTION  01/11/2010   normal   NOCTURNAL POLYSOMNOGRAPHY WITH SEIZURE MONTAGE  05/02/2015   SPLIT NIGHT STUDY  05/02/2015   US ECHOCARDIOGRAPHY  03/22/2009   mild basal septal LV hypertropher,mild diastolic dysfx,consider early hypertrophic cardiomyopathy    Allergies  No Known Allergies  History of Present Illness    Jeffery Strickland comes today for ongoing assessment and management of hypertension, hyperlipidemia, chest discomfort with normal coronary CTA with calcium score of 0.  He comes today without any complaints.  He is very active he rides a bike 5 to 10 miles at a time 3 times a week he is followed by his primary care provider for labs and monitoring of prediabetes.  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  He is doing well and medically compliant.  Home Medications    Current  Outpatient Medications  Medication Sig Dispense Refill   BYSTOLIC 2.5 MG tablet TAKE 1 TABLET BY MOUTH DAILY. 30 tablet 6   potassium chloride SA (K-DUR,KLOR-CON) 20 MEQ tablet   4   rosuvastatin (CRESTOR) 10 MG tablet Take 10 mg by mouth daily.     spironolactone (ALDACTONE) 25 MG tablet Take 25 mg by mouth daily.   7   TRIBENZOR 40-10-12.5 MG TABS Take 1 tablet by mouth daily. Take 1 tab daily     No current facility-administered medications for this visit.     Family History    Family History  Problem Relation Age of Onset   Heart attack Father    Cancer Sister    Diabetes Sister    He indicated that his father is deceased. He indicated that one of his three sisters is deceased.  Social History    Social History   Socioeconomic History   Marital status: Married    Spouse name: Not on file   Number of children: Not on file   Years of education: Not on file   Highest education level: Not on file  Occupational History   Occupation: Product manager    Employer: [employer]  Tobacco Use   Smoking status: Never   Smokeless tobacco: Never  Vaping Use   Vaping Use: Never used  Substance and Sexual Activity   Alcohol use: No    Alcohol/week: 0.0 standard drinks of alcohol  Drug use: No   Sexual activity: Not on file  Other Topics Concern   Not on file  Social History Narrative   Not on file   Social Determinants of Health   Financial Resource Strain: Not on file  Food Insecurity: Not on file  Transportation Needs: Not on file  Physical Activity: Not on file  Stress: Not on file  Social Connections: Not on file  Intimate Partner Violence: Not on file     Review of Systems    General:  No chills, fever, night sweats or weight changes.  Cardiovascular:  No chest pain, dyspnea on exertion, edema, orthopnea, palpitations, paroxysmal nocturnal dyspnea. Dermatological: No rash, lesions/masses Respiratory: No cough,  dyspnea Urologic: No hematuria, dysuria Abdominal:   No nausea, vomiting, diarrhea, bright red blood per rectum, melena, or hematemesis Neurologic:  No visual changes, wkns, changes in mental status. All other systems reviewed and are otherwise negative except as noted above.     Physical Exam    VS:  BP 120/76   Pulse (!) 52   Ht _0  (1.702 m)   Wt 170 lb (77.1 kg)   SpO2 96%   BMI 26.63 kg/m  , BMI Body mass index is 26.63 kg/m.     GEN: Well nourished, well developed, in no acute distress. HEENT: normal. Neck: Supple, no JVD, carotid bruits, or masses. Cardiac: RRR, no murmurs, rubs, or gallops. No clubbing, cyanosis, edema.  Radials/DP/PT 2+ and equal bilaterally.  Respiratory:  Respirations regular and unlabored, clear to auscultation bilaterally. GI: Soft, nontender, nondistended, BS + x 4. MS: no deformity or atrophy. Skin: warm and dry, no rash. Neuro:  Strength and sensation are intact. Psych: Normal affect.  Accessory Clinical Findings    ECG personally reviewed by me today-sinus bradycardia, heart rate of 52 bpm, mild LVH with T wave inversion in V6 and lead I.- No acute changes  Lab Results  Component Value Date   WBC 4.5 11/04/2018   HGB 15.8 11/04/2018   HCT 50.1 11/04/2018   MCV 91.3 11/04/2018   PLT 195 11/04/2018   Lab Results  Component Value Date   CREATININE 1.50 (H) 07/14/2020   BUN 23 07/14/2020   NA 138 07/14/2020   K 4.3 07/14/2020   CL 104 07/14/2020   CO2 19 (L) 07/14/2020   Lab Results  Component Value Date   ALT 23 11/22/2013   AST 17 11/22/2013   ALKPHOS 54 11/22/2013   BILITOT 0.6 11/22/2013   No results found for: "CHOL", "HDL", "LDLCALC", "LDLDIRECT", "TRIG", "CHOLHDL"  Lab Results  Component Value Date   HGBA1C 5.7 11/22/2013    Review of Prior Studies:  Coronary CTA 07/06/2020 IMPRESSION: 1. No evidence of CAD, CADRADS = 0.   2. Coronary calcium score of 0. This was 0 percentile for age and sex matched  control.   3. Normal coronary origin with right dominance.   4.  Small PFO without significant shunting.  Echocardiogram 03/02/2015 Left ventricle: The cavity size was normal. Wall thickness was    normal. Systolic function was normal. The estimated ejection    fraction was in the range of 60% to 65%. Wall motion was normal;    there were no regional wall motion abnormalities. Doppler    parameters are consistent with abnormal left ventricular    relaxation (grade 1 diastolic dysfunction).  - Mitral valve: Calcified annulus.    Assessment & Plan   1.  Hypertension: Currently very well controlled on current medication  regimen.  He will remain on spironolactone 25 mg daily, olmesartan/amlodipine/HCTZ, and Bystolic 2.5 mg daily.  Can consider discontinuing beta-blocker due to bradycardia if he becomes symptomatic.  Currently he is doing well and therefore I find no need to make any medication adjustments.  He is followed by his PCP every 3 months with labs.  No labs will be drawn today.  Reinforcement of low-sodium diet as well as continued exercise is given.  2.  Hyperlipidemia: He is on rosuvastatin 10 mg daily.  Follow-up fasting labs are completed by his PCP.  I will not make any changes at this time.  We will see him in a year unless he is symptomatic or deemed important by his primary care provider.  Current medicines are reviewed at length with the patient today.  I have spent 25 min's  dedicated to the care of this patient on the date of this encounter to include pre-visit review of records, assessment, management and diagnostic testing,with shared decision making. Signed, Phill Myron. West Pugh, ANP, AACC   06/24/2021 11:49 AM    [place] [address] Office [phone] Fax [phone]  Notice: This dictation was prepared with Dragon dictation along with smaller phrase technology. Any transcriptional errors that result from this process  are unintentional and may not be corrected upon review.

## 2021-06-24 ENCOUNTER — Ambulatory Visit: Payer: BC Managed Care – PPO | Admitting: Adult Health

## 2021-06-24 ENCOUNTER — Encounter: Payer: Self-pay | Admitting: Adult Health

## 2021-06-24 VITALS — BP 120/76 | HR 52 | Ht 67.0 in | Wt 170.0 lb

## 2021-06-24 DIAGNOSIS — I10 Essential (primary) hypertension: Secondary | ICD-10-CM | POA: Diagnosis not present

## 2021-06-24 DIAGNOSIS — E7800 Pure hypercholesterolemia, unspecified: Secondary | ICD-10-CM

## 2021-06-24 NOTE — Patient Instructions (Signed)
Medication Instructions:  No Changes *If you need a refill on your cardiac medications before your next appointment, please call your pharmacy*   Lab Work: No labs If you have labs (blood work) drawn today and your tests are completely normal, you will receive your results only by: MyChart Message (if you have MyChart) OR A paper copy in the mail If you have any lab test that is abnormal or we need to change your treatment, we will call you to review the results.   Testing/Procedures: No Testing   Follow-Up: At [facility], you and your health needs are our priority.  As part of our continuing mission to provide you with exceptional heart care, we have created designated Provider Care Teams.  These Care Teams include your primary Cardiologist (physician) and Advanced Practice Providers (APPs -  Physician Assistants and Nurse Practitioners) who all work together to provide you with the care you need, when you need it.  We recommend signing up for the patient portal called "MyChart".  Sign up information is provided on this After Visit Summary.  MyChart is used to connect with patients for Virtual Visits (Telemedicine).  Patients are able to view lab/test results, encounter notes, upcoming appointments, etc.  Non-urgent messages can be sent to your provider as well.   To learn more about what you can do with MyChart, go to [email or web address].    Your next appointment:   1 year(s)  The format for your next appointment:   In Person  Provider:   [name], MD       Important Information About Sugar

## 2021-12-07 ENCOUNTER — Ambulatory Visit: Payer: BC Managed Care – PPO | Admitting: Podiatry

## 2022-05-29 ENCOUNTER — Ambulatory Visit (INDEPENDENT_AMBULATORY_CARE_PROVIDER_SITE_OTHER): Payer: BC Managed Care – PPO | Admitting: Podiatry

## 2022-05-29 DIAGNOSIS — M25572 Pain in left ankle and joints of left foot: Secondary | ICD-10-CM

## 2022-05-29 DIAGNOSIS — M19072 Primary osteoarthritis, left ankle and foot: Secondary | ICD-10-CM | POA: Diagnosis not present

## 2022-05-29 MED ORDER — MELOXICAM 7.5 MG PO TABS
7.5000 mg | ORAL_TABLET | Freq: Two times a day (BID) | ORAL | 0 refills | Status: AC
Start: 2022-05-29 — End: ?

## 2022-05-29 NOTE — Progress Notes (Signed)
Chief Complaint  Patient presents with   Foot Pain    Left foot and ankle pain. Patient states that he may have gout. Ongoing for about a week. Stiffness and pain in the ankle area. Hurts when pressured is applied.Has been taking Indomethacin, meloxicam and bystolic .    HPI: 67 y.o. male presenting today with concern of pain in the left ankle x 1 week.  He was concerned that this may be gout.  He notes the pain has been fairly significant.  He states that he normally runs up and down steps for exercise, and these are concrete steps.  He notes that his ankle is sore and stiff after doing this type of exercise.  He wears sneakers.  He denies significant swelling or redness to the ankle.  He has been taking a combination of indomethacin, meloxicam and Bystolic.  He notes these are only helping short-term.  Past Medical History:  Diagnosis Date   CKD (chronic kidney disease)    History of echocardiogram    Echo 3/11:  Normal LV function, mild basal septal hypertrophy, mild diastolic dysfunction, no significant valvular abnormalities // Echo 2/17: EF 60-65%, normal wall motion, grade 1 diastolic dysfunction, MAC   History of nuclear stress test    Myoview 7/16:  EF 64%, normal perfusion, low risk   Hyperlipemia    Hypertension    Hypertensive heart disease    OSA (obstructive sleep apnea) 05/09/2015    Past Surgical History:  Procedure Laterality Date   NM MYOCAR PERF WALL MOTION  01/11/2010   normal   NOCTURNAL POLYSOMNOGRAPHY WITH SEIZURE MONTAGE  05/02/2015   SPLIT NIGHT STUDY  05/02/2015   US ECHOCARDIOGRAPHY  03/22/2009   mild basal septal LV hypertropher,mild diastolic dysfx,consider early hypertrophic cardiomyopathy    No Known Allergies  Review of Systems  Musculoskeletal:  Positive for joint pain.       Left ankle      Physical Exam:  General: The patient is alert and oriented x3 in no acute distress.  Dermatology: Skin is warm, dry and supple bilateral lower  extremities. Interspaces are clear of maceration and debris.    Vascular: Palpable pedal pulses bilaterally. Capillary refill within normal limits.  No appreciable edema.  No erythema or calor.  Neurological: Light touch sensation grossly intact bilateral feet.   Musculoskeletal Exam: There is mild pain on palpation to the anteromedial aspect of the left ankle.  There is no lateral ankle pain on palpation.  Ankle and subtalar range of motion do not reproduce pain today and there is no crepitus.  Radiographic Exam (left ankle views obtained 05/29/2022):  Normal osseous mineralization.  There is decreased joint space along the medial ankle gutter.  There is a small spur at the inferior aspect of the medial malleolus.  No fracture is seen.  No loose body within the ankle joint is noted assessment/Plan of Care:  Assessment: 1. Acute left ankle pain   2. Primary osteoarthritis of left ankle     Meds ordered this encounter  Medications   meloxicam (MOBIC) 7.5 MG tablet    Sig: Take 1 tablet (7.5 mg total) by mouth in the morning and at bedtime.    Dispense:  40 tablet    Refill:  0   DG ANKLE COMPLETE LEFT  Discussed clinical and radiographic findings with patient today. Discussed conservative options to manage the ankle arthritis pain.  Recommended cortisone injections, anti-inflammatory medication, immobilization in a walking boot,  and an elastic ankle sleeve.  He refused the cortisone injection today.  He may massage Voltaren gel to the area twice daily as needed for pain.  He was fitted for an elastic ankle sleeve size large to wear when he is running steps or working out in general.  Prescription for meloxicam 7.5 mg 1 tablet p.o. twice daily with no refills.  He was instructed to discontinue the other medications he is currently been taking for the pain.  He understood.    Follow-up as needed   Clerance Lav, DPM, FACFAS [facility]     [address] [place], [place] [address]                Office [phone]  Fax [phone]

## 2023-01-23 ENCOUNTER — Other Ambulatory Visit: Payer: Self-pay | Admitting: Family Medicine

## 2023-01-23 ENCOUNTER — Ambulatory Visit
Admission: RE | Admit: 2023-01-23 | Discharge: 2023-01-23 | Disposition: A | Discharge status: Home / Self Care | Payer: BC Managed Care – PPO | Source: Ambulatory Visit | Attending: Family Medicine | Admitting: Family Medicine

## 2023-01-23 DIAGNOSIS — M19012 Primary osteoarthritis, left shoulder: Secondary | ICD-10-CM

## 2023-04-20 ENCOUNTER — Encounter: Payer: Self-pay | Admitting: Adult Health

## 2023-04-25 ENCOUNTER — Other Ambulatory Visit: Payer: Self-pay

## 2023-04-25 ENCOUNTER — Ambulatory Visit: Attending: Family Medicine

## 2023-04-25 DIAGNOSIS — M6281 Muscle weakness (generalized): Secondary | ICD-10-CM | POA: Diagnosis present

## 2023-04-25 DIAGNOSIS — G8929 Other chronic pain: Secondary | ICD-10-CM | POA: Diagnosis present

## 2023-04-25 DIAGNOSIS — R293 Abnormal posture: Secondary | ICD-10-CM | POA: Insufficient documentation

## 2023-04-25 DIAGNOSIS — M25512 Pain in left shoulder: Secondary | ICD-10-CM | POA: Diagnosis present

## 2023-04-25 NOTE — Therapy (Signed)
 OUTPATIENT PHYSICAL THERAPY SHOULDER EVALUATION   Patient Name: Jeffery Strickland MRN: [id number] DOB:1955/08/22, 68 y.o., male Today's Date: 04/25/2023  END OF SESSION:   Past Medical History:  Diagnosis Date   CKD (chronic kidney disease)    History of echocardiogram    Echo 3/11:  Normal LV function, mild basal septal hypertrophy, mild diastolic dysfunction, no significant valvular abnormalities // Echo 2/17: EF 60-65%, normal wall motion, grade 1 diastolic dysfunction, MAC   History of nuclear stress test    Myoview 7/16:  EF 64%, normal perfusion, low risk   Hyperlipemia    Hypertension    Hypertensive heart disease    OSA (obstructive sleep apnea) 05/09/2015   Past Surgical History:  Procedure Laterality Date   NM MYOCAR PERF WALL MOTION  01/11/2010   normal   NOCTURNAL POLYSOMNOGRAPHY WITH SEIZURE MONTAGE  05/02/2015   SPLIT NIGHT STUDY  05/02/2015   US ECHOCARDIOGRAPHY  03/22/2009   mild basal septal LV hypertropher,mild diastolic dysfx,consider early hypertrophic cardiomyopathy   Patient Active Problem List   Diagnosis Date Noted   OSA (obstructive sleep apnea) 05/09/2015   Chest discomfort 07/03/2014   HTN (hypertension) 08/11/2012   Hyperlipidemia 08/11/2012   Hypertensive heart disease without CHF 08/11/2012   Other ejaculatory dysfunction 08/11/2012    PCP: Renaye Rakers, MD  REFERRING PROVIDER: Renaye Rakers, MD  REFERRING DIAG: [phone] (ICD-10-CM) - Pain in left shoulder   THERAPY DIAG:  No diagnosis found.  Rationale for Evaluation and Treatment: Rehabilitation  ONSET DATE: ***  SUBJECTIVE:                                                                                                                                                                                      SUBJECTIVE STATEMENT: ***  Hand dominance: {MISC; OT HAND DOMINANCE:[phone]}  PERTINENT HISTORY: HTN, CKD  PAIN:  Are you having pain?  Yes: NPRS scale: 5/10 Worst: 10/10 Pain  location: L shoulder Pain description: sharp, sore Aggravating factors: OH reaching, laying on L side Relieving factors: my  PRECAUTIONS: None  RED FLAGS: None   WEIGHT BEARING RESTRICTIONS: No  FALLS:  Has patient fallen in last 6 months? No  LIVING ENVIRONMENT: Lives with: lives with their family Lives in: House/apartment  OCCUPATION: STEM Teacher at [employer]  PLOF: Independent  PATIENT GOALS:***  NEXT MD VISIT: August 2025  OBJECTIVE:  Note: Objective measures were completed at Evaluation unless otherwise noted.  DIAGNOSTIC FINDINGS:  ***  PATIENT SURVEYS:  Quick Dash: ***  COGNITION: Overall cognitive status: Within functional limits for tasks assessed     SENSATION: WFL  POSTURE: Rounded shoulder, fwd head  UPPER EXTREMITY ROM:   {  AROM/PROM:27142} ROM Right eval Left eval  Shoulder flexion  98  Shoulder extension    Shoulder abduction  80  Shoulder adduction    Shoulder internal rotation    Shoulder external rotation    Elbow flexion    Elbow extension    Wrist flexion    Wrist extension    Wrist ulnar deviation    Wrist radial deviation    Wrist pronation    Wrist supination    (Blank rows = not tested)  UPPER EXTREMITY MMT:  MMT Right eval Left eval  Shoulder flexion    Shoulder extension    Shoulder abduction    Shoulder adduction    Shoulder internal rotation    Shoulder external rotation    Middle trapezius    Lower trapezius    Elbow flexion    Elbow extension    Wrist flexion    Wrist extension    Wrist ulnar deviation    Wrist radial deviation    Wrist pronation    Wrist supination    Grip strength (lbs)    (Blank rows = not tested)  SHOULDER SPECIAL TESTS: Impingement tests: {shoulder impingement test:25231:a} SLAP lesions: {SLAP lesions:25232} Instability tests: {shoulder instability test:25233} Rotator cuff assessment: {rotator cuff assessment:25234} Biceps assessment: {biceps  assessment:25235}  JOINT MOBILITY TESTING:  ***  PALPATION:  ***   TREATMENT: [facility] Adult PT Treatment:                                                DATE: 04/25/2023 Therapeutic Exercise: ***  PATIENT EDUCATION: Education details: eval findings, Quick DASH, HEP, POC Person educated: Patient Education method: Explanation, Demonstration, and Handouts Education comprehension: verbalized understanding and returned demonstration  HOME EXERCISE PROGRAM: Access Code: [email or web address] URL: [email or web address] Date: 04/25/2023 Prepared by: Edwinna Areola  Exercises - Standing Shoulder Row with Anchored Resistance  - 1 x daily - 7 x weekly - 3 sets - 10 reps - blue band hold - Supine Shoulder Flexion Extension Full Range AROM  - 1 x daily - 7 x weekly - 3 sets - 10 reps - Standing Isometric Shoulder External Rotation with Doorway and Towel Roll  - 1 x daily - 7 x weekly - 3 sets - 10 reps - 5 sec hold - Corner Pec Major Stretch  - 1 x daily - 7 x weekly - 2 reps - 30 sec hold  ASSESSMENT:  CLINICAL IMPRESSION: Patient is a *** y.o. *** who was seen today for physical therapy evaluation and treatment for ***.   OBJECTIVE IMPAIRMENTS: {opptimpairments:25111}.   ACTIVITY LIMITATIONS: {activitylimitations:27494}  PARTICIPATION LIMITATIONS: {participationrestrictions:25113}  PERSONAL FACTORS: {Personal factors:25162} are also affecting patient's functional outcome.   REHAB POTENTIAL: {rehabpotential:25112}  CLINICAL DECISION MAKING: {clinical decision making:25114}  EVALUATION COMPLEXITY: {Evaluation complexity:25115}   GOALS: Goals reviewed with patient? No  SHORT TERM GOALS: Target date: 05/16/2023   Pt will be compliant and knowledgeable with initial HEP for improved comfort and carryover Baseline: initial HEP given  Goal status: INITIAL  2.  Pt will self report *** pain no greater than ***/10 for improved comfort and functional ability Baseline: ***/10 at  worst Goal status: {GOALSTATUS:25110}   LONG TERM GOALS: Target date: 06/20/2023   Pt will decrease Quick DASH disability score to no greater than *** as proxy for functional improvement Baseline: ***  Goal status: INITIAL  2.  Pt will self report *** pain no greater than ***/10 for improved comfort and functional ability Baseline: ***/10 at worst Goal status: {GOALSTATUS:25110}   3.  *** Baseline:  Goal status: INITIAL  4.  *** Baseline:  Goal status: INITIAL  5.  *** Baseline:  Goal status: INITIAL  6.  *** Baseline:  Goal status: INITIAL  PLAN:  PT FREQUENCY: {rehab frequency:25116}  PT DURATION: {rehab duration:25117}  PLANNED INTERVENTIONS: {rehab planned interventions:25118::"97110-Therapeutic exercises","97530- Therapeutic [phone]- Neuromuscular re-education","97535- Self JXBJ","47829- Manual therapy"}  PLAN FOR NEXT SESSION: ***   Eloy End, PT 04/25/2023, 3:47 PM

## 2023-05-17 ENCOUNTER — Ambulatory Visit: Attending: Family Medicine

## 2023-05-17 DIAGNOSIS — M25512 Pain in left shoulder: Secondary | ICD-10-CM | POA: Insufficient documentation

## 2023-05-17 DIAGNOSIS — M6281 Muscle weakness (generalized): Secondary | ICD-10-CM

## 2023-05-17 DIAGNOSIS — R293 Abnormal posture: Secondary | ICD-10-CM | POA: Diagnosis present

## 2023-05-17 DIAGNOSIS — G8929 Other chronic pain: Secondary | ICD-10-CM
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
## 2023-05-22 ENCOUNTER — Ambulatory Visit

## 2023-05-22 DIAGNOSIS — R293 Abnormal posture: Secondary | ICD-10-CM

## 2023-05-22 DIAGNOSIS — M25512 Pain in left shoulder: Secondary | ICD-10-CM | POA: Diagnosis not present

## 2023-05-22 DIAGNOSIS — M6281 Muscle weakness (generalized): Secondary | ICD-10-CM

## 2023-05-22 DIAGNOSIS — G8929 Other chronic pain: Secondary | ICD-10-CM
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
## 2023-05-24 ENCOUNTER — Ambulatory Visit (INDEPENDENT_AMBULATORY_CARE_PROVIDER_SITE_OTHER)

## 2023-05-24 ENCOUNTER — Ambulatory Visit (INDEPENDENT_AMBULATORY_CARE_PROVIDER_SITE_OTHER): Admitting: Podiatry

## 2023-05-24 ENCOUNTER — Encounter: Payer: Self-pay | Admitting: Podiatry

## 2023-05-24 DIAGNOSIS — M25871 Other specified joint disorders, right ankle and foot: Secondary | ICD-10-CM

## 2023-05-24 NOTE — Progress Notes (Signed)
       Chief Complaint  Patient presents with   Bunions    Rm 16/ bunion right foot/ 2 wks / very sensitive to touch/ 2/10 pain    HPI: 68 y.o. male presents today with a new problem, pain under the right big toe joint worsening over the past 2 to 3 weeks.  He denies any specific injury.  States that it bothers him while he is on his feet.  Does not hurt all the time says it is relatively mild today.  Past Medical History:  Diagnosis Date   CKD (chronic kidney disease)    History of echocardiogram    Echo 3/11:  Normal LV function, mild basal septal hypertrophy, mild diastolic dysfunction, no significant valvular abnormalities // Echo 2/17: EF 60-65%, normal wall motion, grade 1 diastolic dysfunction, MAC   History of nuclear stress test    Myoview 7/16:  EF 64%, normal perfusion, low risk   Hyperlipemia    Hypertension    Hypertensive heart disease    OSA (obstructive sleep apnea) 05/09/2015    Past Surgical History:  Procedure Laterality Date   NM MYOCAR PERF WALL MOTION  01/11/2010   normal   NOCTURNAL POLYSOMNOGRAPHY WITH SEIZURE MONTAGE  05/02/2015   SPLIT NIGHT STUDY  05/02/2015   US  ECHOCARDIOGRAPHY  03/22/2009   mild basal septal LV hypertropher,mild diastolic dysfx,consider early hypertrophic cardiomyopathy    No Known Allergies  ROS denies any nausea, vomiting, fever, chills, chest pain, shortness of breath   Physical Exam: There were no vitals filed for this visit.  General: The patient is alert and oriented x3 in no acute distress.  Dermatology: Skin is warm, dry and supple bilateral lower extremities. Interspaces are clear of maceration and debris.    Vascular: Palpable pedal pulses bilaterally. Capillary refill within normal limits.  No appreciable edema.  No erythema or calor.  Neurological: Light touch sensation grossly intact bilateral feet.   Musculoskeletal Exam: Fat pad atrophy under the right first metatarsal phalangeal joint noted.  Prominent tibial  sesamoid.  Mild to moderate tenderness on palpation here.  No pain with first MPJ range of motion.  No symptomatic limitations in pedal range of motion.  Radiographic Exam: Right foot 3 views weightbearing 05/24/2023 Normal osseous mineralization. Joint spaces preserved.  No fractures or osseous irregularities noted.  Assessment/Plan of Care: 1. Sesamoiditis of right foot      No orders of the defined types were placed in this encounter.  DG FOOT COMPLETE RIGHT  Discussed clinical findings with patient today.  Radiographs reviewed with patient.  Findings consistent with sesamoiditis.  Recommend offloading with dancers pads at this point. May take short course of OTC NSAIDs as needed such as Naproxen 500 mg BID for 1-2 weeks.  Deferring corticosteroid injection at this point due to mild symptoms.  Utilize RICE therapy. Follow up in 3-4 weeks if symptoms do not improve or worsen. Did discuss if this worsens, could require period of CAM boot immobilization.   Tanith Dagostino L. Lunda Salines, AACFAS [facility]     [address] [place], [place] [address]                Office [phone]  Fax [phone]

## 2023-05-24 NOTE — Patient Instructions (Signed)
 More silicone pads can be purchased from:  [email or web address]  Look for Felt Dancer's pads for your right foot.  You can also buy pads for the left foot which would be a mirror image to be put directly in your shoe.  These can also be found on [employer] or [place]

## 2023-06-13 ENCOUNTER — Ambulatory Visit
